# Patient Record
Sex: Female | Born: 1995 | Race: Black or African American | Hispanic: No | Marital: Single | Smoking: Never smoker
Health system: Southern US, Community
[De-identification: ages and names within clinical notes are randomized; demographics above are authoritative.]

## PROBLEM LIST (undated history)

## (undated) DIAGNOSIS — D649 Anemia, unspecified: Secondary | ICD-10-CM

## (undated) DIAGNOSIS — D219 Benign neoplasm of connective and other soft tissue, unspecified: Secondary | ICD-10-CM

## (undated) DIAGNOSIS — Z789 Other specified health status: Secondary | ICD-10-CM

## (undated) HISTORY — DX: Benign neoplasm of connective and other soft tissue, unspecified: D21.9

## (undated) HISTORY — DX: Other specified health status: Z78.9

## (undated) HISTORY — PX: NO PAST SURGERIES: SHX2092

---
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
## 2016-06-17 ENCOUNTER — Encounter (HOSPITAL_COMMUNITY): Payer: Self-pay | Admitting: Emergency Medicine

## 2016-06-17 ENCOUNTER — Ambulatory Visit (HOSPITAL_COMMUNITY)
Admission: EM | Admit: 2016-06-17 | Discharge: 2016-06-17 | Disposition: A | Discharge status: Home / Self Care | Payer: Medicaid Other | Attending: Family Medicine | Admitting: Family Medicine

## 2016-06-17 DIAGNOSIS — K2900 Acute gastritis without bleeding: Secondary | ICD-10-CM

## 2016-06-17 MED ORDER — ESOMEPRAZOLE MAGNESIUM 40 MG PO CPDR: 40.0000 mg | DELAYED_RELEASE_CAPSULE | Freq: Every day | ORAL | 0 refills | Status: DC

## 2016-06-17 NOTE — ED Triage Notes (Signed)
Patient presents to [facility] today with Upper Abdominal pain, in the middle. Patient states that her pain does not change with the different foods she eats.

## 2016-06-17 NOTE — ED Provider Notes (Signed)
[facility]    CSN: [id number] Arrival date & time: 06/17/16  1340     History   Chief Complaint Chief Complaint  Patient presents with  . Abdominal Pain    HPI Gina Burke is a 20 y.o. female.   This is a 20 year old woman who comes in with upper abdominal pain unrelated to food.  She's had this problem in the past but usually gone away. At the present time the pain sometimes does radiate up into her substernal area.  Patient's had no fever, nausea, vomiting, blood in stool, diarrhea.  Patient works for [employer].      Past Medical History:  Diagnosis Date  . Medical history non-contributory     Patient Active Problem List   Diagnosis Date Noted  . Refugee health examination 02/09/2013  . Dysmenorrhea 02/09/2013  . Pediatric body mass index (BMI) of 5th percentile to less than 85th percentile for age 10/12/2012    History reviewed. No pertinent surgical history.  OB History    No data available       Home Medications    Prior to Admission medications   Medication Sig Start Date End Date Taking? Authorizing Provider  esomeprazole (NEXIUM) 40 MG capsule Take 1 capsule (40 mg total) by mouth daily. 06/17/16   Robyn Haber, MD    Family History Family History  Problem Relation Age of Onset  . Heart disease Brother     Social History Social History  Substance Use Topics  . Smoking status: Never Smoker  . Smokeless tobacco: Never Used  . Alcohol use No     Allergies   Review of patient's allergies indicates no known allergies.   Review of Systems Review of Systems  Constitutional: Negative.   Respiratory: Negative.   Cardiovascular: Negative.   Gastrointestinal: Positive for abdominal pain. Negative for blood in stool, diarrhea, nausea and vomiting.  Genitourinary: Negative.   Musculoskeletal: Negative.      Physical Exam Triage Vital Signs ED Triage Vitals  Enc Vitals Group     BP 06/17/16 1414  107/69     Pulse Rate 06/17/16 1414 82     Resp 06/17/16 1414 12     Temp 06/17/16 1414 98.2 F (36.8 C)     Temp Source 06/17/16 1414 Oral     SpO2 06/17/16 1414 100 %     Weight --      Height --      Head Circumference --      Peak Flow --      Pain Score 06/17/16 1420 8     Pain Loc --      Pain Edu? --      Excl. in [place]? --    No data found.   Updated Vital Signs BP 107/69 (BP Location: Left Arm)   Pulse 82   Temp 98.2 F (36.8 C) (Oral)   Resp 12   LMP 05/25/2016   SpO2 100%   Physical Exam  Constitutional: She is oriented to person, place, and time. She appears well-developed and well-nourished.  HENT:  Head: Normocephalic.  Right Ear: External ear normal.  Left Ear: External ear normal.  Mouth/Throat: Oropharynx is clear and moist.  Eyes: Conjunctivae and EOM are normal. Pupils are equal, round, and reactive to light.  Neck: Normal range of motion. Neck supple.  Cardiovascular: Normal rate, regular rhythm and normal heart sounds.   Pulmonary/Chest: Effort normal and breath sounds normal.  Abdominal: Soft. Bowel sounds are  normal. There is tenderness. There is no rebound and no guarding.  Musculoskeletal: Normal range of motion.  Neurological: She is alert and oriented to person, place, and time.  Skin: Skin is warm and dry.  Nursing note and vitals reviewed.    UC Treatments / Results  Labs (all labs ordered are listed, but only abnormal results are displayed) Labs Reviewed - No data to display  EKG  EKG Interpretation None       Radiology No results found.  Procedures Procedures (including critical care time)  Medications Ordered in UC Medications - No data to display   Initial Impression / Assessment and Plan / UC Course  I have reviewed the triage vital signs and the nursing notes.  Pertinent labs & imaging results that were available during my care of the patient were reviewed by me and considered in my medical decision making (see  chart for details).  Clinical Course    Final Clinical Impressions(s) / UC Diagnoses   Final diagnoses:  Acute superficial gastritis without hemorrhage    New Prescriptions New Prescriptions   ESOMEPRAZOLE (NEXIUM) 40 MG CAPSULE    Take 1 capsule (40 mg total) by mouth daily.     Robyn Haber, MD 06/17/16 [phone]

## 2019-10-26 ENCOUNTER — Other Ambulatory Visit: Payer: Self-pay

## 2019-10-26 ENCOUNTER — Encounter (HOSPITAL_COMMUNITY): Payer: Self-pay

## 2019-10-26 ENCOUNTER — Ambulatory Visit (INDEPENDENT_AMBULATORY_CARE_PROVIDER_SITE_OTHER): Payer: Self-pay

## 2019-10-26 ENCOUNTER — Ambulatory Visit (HOSPITAL_COMMUNITY)
Admission: EM | Admit: 2019-10-26 | Discharge: 2019-10-26 | Disposition: A | Discharge status: Home / Self Care | Payer: Self-pay | Attending: Family Medicine | Admitting: Family Medicine

## 2019-10-26 DIAGNOSIS — R110 Nausea: Secondary | ICD-10-CM

## 2019-10-26 DIAGNOSIS — R109 Unspecified abdominal pain: Secondary | ICD-10-CM

## 2019-10-26 DIAGNOSIS — Z3202 Encounter for pregnancy test, result negative: Secondary | ICD-10-CM

## 2019-10-26 DIAGNOSIS — K5900 Constipation, unspecified: Secondary | ICD-10-CM

## 2019-10-26 LAB — COMPREHENSIVE METABOLIC PANEL
ALT: 14 U/L (ref 0–44)
AST: 20 U/L (ref 15–41)
Albumin: 3.9 g/dL (ref 3.5–5.0)
Alkaline Phosphatase: 45 U/L (ref 38–126)
Anion gap: 8 (ref 5–15)
BUN: <5 mg/dL — ABNORMAL LOW (ref 6–20)
CO2: 26 mmol/L (ref 22–32)
Calcium: 9.3 mg/dL (ref 8.9–10.3)
Chloride: 107 mmol/L (ref 98–111)
Creatinine, Ser: 0.66 mg/dL (ref 0.44–1.00)
GFR calc Af Amer: >60 mL/min (ref >60)
GFR calc non Af Amer: >60 mL/min (ref >60)
Glucose, Bld: 84 mg/dL (ref 70–99)
Potassium: 3.8 mmol/L (ref 3.5–5.1)
Sodium: 141 mmol/L (ref 135–145)
Total Bilirubin: 0.6 mg/dL (ref 0.3–1.2)
Total Protein: 6.9 g/dL (ref 6.5–8.1)

## 2019-10-26 LAB — POCT URINALYSIS DIP (DEVICE)
Bilirubin Urine: NEGATIVE
Glucose, UA: NEGATIVE mg/dL
Ketones, ur: NEGATIVE mg/dL
Leukocytes,Ua: NEGATIVE
Nitrite: NEGATIVE
Protein, ur: NEGATIVE mg/dL
Specific Gravity, Urine: >=1.03 (ref 1.005–1.030)
Urobilinogen, UA: 0.2 mg/dL (ref 0.0–1.0)
pH: 6.5 (ref 5.0–8.0)

## 2019-10-26 LAB — CBC WITH DIFFERENTIAL/PLATELET
Abs Immature Granulocytes: 0.01 10*3/uL (ref 0.00–0.07)
Basophils Absolute: 0 10*3/uL (ref 0.0–0.1)
Basophils Relative: 0 %
Eosinophils Absolute: 0 10*3/uL (ref 0.0–0.5)
Eosinophils Relative: 1 %
HCT: 40.1 % (ref 36.0–46.0)
Hemoglobin: 13.4 g/dL (ref 12.0–15.0)
Immature Granulocytes: 0 %
Lymphocytes Relative: 45 %
Lymphs Abs: 1.7 10*3/uL (ref 0.7–4.0)
MCH: 31.2 pg (ref 26.0–34.0)
MCHC: 33.4 g/dL (ref 30.0–36.0)
MCV: 93.3 fL (ref 80.0–100.0)
Monocytes Absolute: 0.4 10*3/uL (ref 0.1–1.0)
Monocytes Relative: 10 %
Neutro Abs: 1.6 10*3/uL — ABNORMAL LOW (ref 1.7–7.7)
Neutrophils Relative %: 44 %
Platelets: 221 10*3/uL (ref 150–400)
RBC: 4.3 MIL/uL (ref 3.87–5.11)
RDW: 11.6 % (ref 11.5–15.5)
WBC: 3.8 10*3/uL — ABNORMAL LOW (ref 4.0–10.5)
nRBC: 0 % (ref 0.0–0.2)

## 2019-10-26 LAB — CBG MONITORING, ED
Glucose-Capillary: 65 mg/dL — ABNORMAL LOW (ref 70–99)
Glucose-Capillary: 65 mg/dL — ABNORMAL LOW (ref 70–99)

## 2019-10-26 LAB — POC URINE PREG, ED: Preg Test, Ur: NEGATIVE

## 2019-10-26 LAB — LIPASE, BLOOD: Lipase: 29 U/L (ref 11–51)

## 2019-10-26 LAB — POCT PREGNANCY, URINE: Preg Test, Ur: NEGATIVE

## 2019-10-26 LAB — GLUCOSE, CAPILLARY: Glucose-Capillary: 65 mg/dL — ABNORMAL LOW (ref 70–99)

## 2019-10-26 MED ORDER — ONDANSETRON 4 MG PO TBDP: 4.0000 mg | ORAL_TABLET | Freq: Three times a day (TID) | ORAL | 0 refills | Status: DC | PRN

## 2019-10-26 NOTE — Discharge Instructions (Signed)
You have been seen today for abdominal pain. Your evaluation was not suggestive of any emergent condition requiring medical intervention at this time. However, some abdominal problems make take more time to appear. Therefore, it is very important for you to pay attention to any new symptoms or worsening of your current condition.  Please return here or to the Emergency Department immediately should you begin to feel worse in any way or have any of the following symptoms: increasing or different abdominal pain, persistent vomiting, inability to drink fluids, fevers, or shaking chills.   We have sent testing for sexually transmitted infections. We will notify you of any positive results once they are received. If required, we will prescribe any medications you might need.  Please refrain from all sexual activity for at least the next seven days.    

## 2019-10-26 NOTE — ED Triage Notes (Signed)
Pt present abdominal pain with dry lips, symptoms started over a week ago.  Pt states that when she eats it feels like she is going to vomit.

## 2019-10-26 NOTE — ED Provider Notes (Signed)
[place]   [id number] 10/26/19 Arrival Time: [id number]  ASSESSMENT & PLAN:  1. Abdominal discomfort   2. Constipation, unspecified constipation type   3. Nausea without vomiting     I have personally viewed the imaging studies ordered this visit. Much stool. No signs of SBO.  Will begin taking her acid reflux medication daily; possible GERD component to current symptoms. Given duration of discomfort with no specific worsening and no fever I have a low suspicion for appendicitis but did discuss. Tolerating fluids. Will also begin taking Miralax. CBG WNL. UPT negative.   Meds ordered this encounter  Medications  . ondansetron (ZOFRAN-ODT) 4 MG disintegrating tablet    Sig: Take 1 tablet (4 mg total) by mouth every 8 (eight) hours as needed for nausea or vomiting.    Dispense:  15 tablet    Refill:  0    Pending: Labs Reviewed  CBC WITH DIFFERENTIAL/PLATELET  COMPREHENSIVE METABOLIC PANEL  LIPASE, BLOOD  CERVICOVAGINAL ANCILLARY ONLY   No STI empiric treatment desired.     Discharge Instructions     You have been seen today for abdominal pain. Your evaluation was not suggestive of any emergent condition requiring medical intervention at this time. However, some abdominal problems make take more time to appear. Therefore, it is very important for you to pay attention to any new symptoms or worsening of your current condition.  Please return here or to the Emergency Department immediately should you begin to feel worse in any way or have any of the following symptoms: increasing or different abdominal pain, persistent vomiting, inability to drink fluids, fevers, or shaking chills.   We have sent testing for sexually transmitted infections. We will notify you of any positive results once they are received. If required, we will prescribe any medications you might need.  Please refrain from all sexual activity for at least the next seven days.       Reviewed  expectations re: course of current medical issues. Questions answered. Outlined signs and symptoms indicating need for more acute intervention. Patient verbalized understanding. After Visit Summary given.   SUBJECTIVE: History from: patient. Gina Burke is a 24 y.o. female who presents with complaint of fairly persistent non-radiating lower abdominal discomfort. Gradual onset. First noted approx 1 week ago. Mild nausea at times without vomiting. More nausea if she tries to eat. Decreased appetite and PO intake. Afebrile. No specific urinary symptoms. Reports last bowel movement approx one week ago. Normal flatus. H/O GERD; uses Prilosec prn. Has not taken this week. Ambulatory without assistance. Questions occasional h/o constipation. No new medications. No OTC treatment reported.  She is sexually active with one female partner. Reports no concern over STI but would like to be tested.  History reviewed. No pertinent surgical history.   OBJECTIVE:  Vitals:   10/26/19 1016  BP: 117/79  Pulse: 80  Resp: 16  Temp: 98.2 F (36.8 C)  TempSrc: Oral  SpO2: 100%    General appearance: alert, oriented, no acute distress HEENT: [place]; AT Lungs: unlabored respirations; speaks full sentences without difficulty Abdomen: soft; without distention; mild  and poorly localized tenderness to palpation over lower abdomen; without masses or organomegaly; without guarding or rebound tenderness Back: without reported CVA tenderness; FROM at waist Extremities: without LE edema; symmetrical; without gross deformities Skin: warm and dry Neurologic: normal gait Psychological: alert and cooperative; normal mood and affect  Labs: Results for orders placed or performed during the hospital encounter of 10/26/19  Glucose, capillary  Result Value Ref Range   Glucose-Capillary 65 (L) 70 - 99 mg/dL  POC CBG monitoring  Result Value Ref Range   Glucose-Capillary 65 (L) 70 - 99 mg/dL  POCT urinalysis dip (device)   Result Value Ref Range   Glucose, UA NEGATIVE NEGATIVE mg/dL   Bilirubin Urine NEGATIVE NEGATIVE   Ketones, ur NEGATIVE NEGATIVE mg/dL   Specific Gravity, Urine >=1.030 1.005 - 1.030   Hgb urine dipstick SMALL (A) NEGATIVE   pH 6.5 5.0 - 8.0   Protein, ur NEGATIVE NEGATIVE mg/dL   Urobilinogen, UA 0.2 0.0 - 1.0 mg/dL   Nitrite NEGATIVE NEGATIVE   Leukocytes,Ua NEGATIVE NEGATIVE  Pregnancy, urine POC  Result Value Ref Range   Preg Test, Ur NEGATIVE NEGATIVE  POC urine preg, ED (not at [facility])  Result Value Ref Range   Preg Test, Ur NEGATIVE NEGATIVE  CBG monitoring, ED  Result Value Ref Range   Glucose-Capillary 65 (L) 70 - 99 mg/dL   Labs Reviewed  GLUCOSE, CAPILLARY - Abnormal; Notable for the following components:      Result Value   Glucose-Capillary 65 (*)    All other components within normal limits  CBG MONITORING, ED - Abnormal; Notable for the following components:   Glucose-Capillary 65 (*)    All other components within normal limits  POCT URINALYSIS DIP (DEVICE) - Abnormal; Notable for the following components:   Hgb urine dipstick SMALL (*)    All other components within normal limits  CBG MONITORING, ED - Abnormal; Notable for the following components:   Glucose-Capillary 65 (*)    All other components within normal limits  CBC WITH DIFFERENTIAL/PLATELET  COMPREHENSIVE METABOLIC PANEL  LIPASE, BLOOD  POCT PREGNANCY, URINE  POC URINE PREG, ED  CERVICOVAGINAL ANCILLARY ONLY    Imaging: DG Abd 2 Views  Result Date: 10/26/2019 CLINICAL DATA:  Constipation and nausea EXAM: ABDOMEN - 2 VIEW COMPARISON:  None. FINDINGS: Supine and upright images were obtained. There is diffuse stool throughout the colon. There is no bowel dilatation or air-fluid level to suggest bowel obstruction. No free air. Lung bases are clear. No abnormal calcification. IMPRESSION: Diffuse stool throughout colon, a finding likely indicative of constipation. No bowel obstruction or free air. Lung  bases clear. Electronically Signed   By: Lowella Grip III M.D.   On: 10/26/2019 11:46     No Known Allergies                                             Past Medical History:  Diagnosis Date  . Medical history non-contributory     Social History   Socioeconomic History  . Marital status: Single    Spouse name: Not on file  . Number of children: Not on file  . Years of education: Not on file  . Highest education level: Not on file  Occupational History  . Not on file  Tobacco Use  . Smoking status: Never Smoker  . Smokeless tobacco: Never Used  Substance and Sexual Activity  . Alcohol use: No  . Drug use: No  . Sexual activity: Never    Birth control/protection: Abstinence  Other Topics Concern  . Not on file  Social History Narrative  . Not on file   Social Determinants of Health   Financial Resource Strain:   . Difficulty of Paying Living Expenses: Not on file  Food Insecurity:   . Worried About Charity fundraiser in the Last Year: Not on file  . Ran Out of Food in the Last Year: Not on file  Transportation Needs:   . Lack of Transportation (Medical): Not on file  . Lack of Transportation (Non-Medical): Not on file  Physical Activity:   . Days of Exercise per Week: Not on file  . Minutes of Exercise per Session: Not on file  Stress:   . Feeling of Stress : Not on file  Social Connections:   . Frequency of Communication with Friends and Family: Not on file  . Frequency of Social Gatherings with Friends and Family: Not on file  . Attends Religious Services: Not on file  . Active Member of Clubs or Organizations: Not on file  . Attends Archivist Meetings: Not on file  . Marital Status: Not on file  Intimate Partner Violence:   . Fear of Current or Ex-Partner: Not on file  . Emotionally Abused: Not on file  . Physically Abused: Not on file  . Sexually Abused: Not on file    Family History  Problem Relation Age of Onset  . Heart disease  Brother      Vanessa Kick, MD 10/26/19 1434

## 2019-10-28 ENCOUNTER — Telehealth (HOSPITAL_COMMUNITY): Payer: Self-pay | Admitting: Emergency Medicine

## 2019-10-28 LAB — CERVICOVAGINAL ANCILLARY ONLY
Bacterial vaginitis: POSITIVE — AB
Candida vaginitis: NEGATIVE
Chlamydia: NEGATIVE
Neisseria Gonorrhea: NEGATIVE
Trichomonas: NEGATIVE

## 2019-10-28 MED ORDER — METRONIDAZOLE 500 MG PO TABS: 500.0000 mg | ORAL_TABLET | Freq: Two times a day (BID) | ORAL | 0 refills | Status: AC

## 2019-10-28 NOTE — Telephone Encounter (Signed)
Bacterial vaginosis is positive. Pt needs treatment. Flagyl 500 mg BID x 7 days #14 no refills sent to patients pharmacy of choice.    Attempted to reach patient. No answer at this time. Voicemail left.

## 2020-04-12 ENCOUNTER — Ambulatory Visit (HOSPITAL_COMMUNITY): Payer: Self-pay

## 2020-06-27 ENCOUNTER — Other Ambulatory Visit: Payer: Medicaid Other

## 2020-06-27 DIAGNOSIS — Z20822 Contact with and (suspected) exposure to covid-19: Secondary | ICD-10-CM

## 2020-06-28 LAB — SARS-COV-2, NAA 2 DAY TAT

## 2020-06-28 LAB — NOVEL CORONAVIRUS, NAA: SARS-CoV-2, NAA: NOT DETECTED

## 2020-07-25 ENCOUNTER — Encounter (HOSPITAL_COMMUNITY): Payer: Self-pay | Admitting: *Deleted

## 2020-07-25 ENCOUNTER — Other Ambulatory Visit: Payer: Self-pay

## 2020-07-25 ENCOUNTER — Ambulatory Visit (HOSPITAL_COMMUNITY)
Admission: EM | Admit: 2020-07-25 | Discharge: 2020-07-25 | Disposition: A | Discharge status: Home / Self Care | Payer: BC Managed Care – PPO | Attending: Internal Medicine | Admitting: Internal Medicine

## 2020-07-25 DIAGNOSIS — R1084 Generalized abdominal pain: Secondary | ICD-10-CM

## 2020-07-25 DIAGNOSIS — R112 Nausea with vomiting, unspecified: Secondary | ICD-10-CM | POA: Diagnosis not present

## 2020-07-25 LAB — POCT URINALYSIS DIPSTICK, ED / UC
Bilirubin Urine: NEGATIVE
Glucose, UA: NEGATIVE mg/dL
Ketones, ur: NEGATIVE mg/dL
Leukocytes,Ua: NEGATIVE
Nitrite: NEGATIVE
Protein, ur: NEGATIVE mg/dL
Specific Gravity, Urine: 1.01 (ref 1.005–1.030)
Urobilinogen, UA: 0.2 mg/dL (ref 0.0–1.0)
pH: 7 (ref 5.0–8.0)

## 2020-07-25 LAB — POC URINE PREG, ED: Preg Test, Ur: NEGATIVE

## 2020-07-25 MED ORDER — ONDANSETRON HCL 4 MG PO TABS: 4.0000 mg | ORAL_TABLET | Freq: Three times a day (TID) | ORAL | 0 refills | Status: DC | PRN

## 2020-07-25 NOTE — ED Triage Notes (Signed)
Pt unsure if she is preg. Pt has nausea and ABD pain

## 2020-07-25 NOTE — ED Provider Notes (Signed)
[place]    CSN: [id number] Arrival date & time: 07/25/20  1216      History   Chief Complaint Chief Complaint  Patient presents with  . Abdominal Pain    HPI Gina Burke is a 24 y.o. female.   Patient here c/w abdominal pain x 2 weeks.  Waxes and wanes, lasts about 20 minutes then resolves.  She has it multiple times per day.  Denies f/c, diarrhea, constipation, hematochezia, melena, GU sx.  LMP 1 month ago.  She has not tried anything for the pain.  Not currently in any pain at time of evaluation.     Past Medical History:  Diagnosis Date  . Medical history non-contributory     Patient Active Problem List   Diagnosis Date Noted  . Refugee health examination 02/09/2013  . Dysmenorrhea 02/09/2013  . Pediatric body mass index (BMI) of 5th percentile to less than 85th percentile for age 20/24/2014    History reviewed. No pertinent surgical history.  OB History   No obstetric history on file.      Home Medications    Prior to Admission medications   Medication Sig Start Date End Date Taking? Authorizing Provider  esomeprazole (NEXIUM) 40 MG capsule Take 1 capsule (40 mg total) by mouth daily. 06/17/16   Robyn Haber, MD  ondansetron (ZOFRAN-ODT) 4 MG disintegrating tablet Take 1 tablet (4 mg total) by mouth every 8 (eight) hours as needed for nausea or vomiting. 10/26/19   Vanessa Kick, MD    Family History Family History  Problem Relation Age of Onset  . Heart disease Brother     Social History Social History   Tobacco Use  . Smoking status: Never Smoker  . Smokeless tobacco: Never Used  Substance Use Topics  . Alcohol use: No  . Drug use: No     Allergies   Patient has no known allergies.   Review of Systems Review of Systems  Constitutional: Negative for chills, fatigue and fever.  Gastrointestinal: Positive for abdominal distention, abdominal pain, nausea and vomiting (last episode 3 days ago). Negative for anal bleeding,  blood in stool, constipation and diarrhea.  Genitourinary: Negative for difficulty urinating, dysuria, flank pain, frequency, menstrual problem, pelvic pain, urgency, vaginal bleeding, vaginal discharge and vaginal pain.  Musculoskeletal: Negative for arthralgias and myalgias.  Skin: Negative for color change.  Hematological: Negative for adenopathy. Does not bruise/bleed easily.  Psychiatric/Behavioral: Negative for confusion and sleep disturbance.     Physical Exam Triage Vital Signs ED Triage Vitals  Enc Vitals Group     BP 07/25/20 1404 114/79     Pulse Rate 07/25/20 1404 82     Resp 07/25/20 1404 16     Temp 07/25/20 1404 97.7 F (36.5 C)     Temp src --      SpO2 07/25/20 1404 98 %     Weight --      Height --      Head Circumference --      Peak Flow --      Pain Score 07/25/20 1400 0     Pain Loc --      Pain Edu? --      Excl. in [place]? --    No data found.  Updated Vital Signs BP 114/79 (BP Location: Right Arm)   Pulse 82   Temp 97.7 F (36.5 C)   Resp 16   LMP 06/26/2020 Comment: possible preg  SpO2 98%   Visual Acuity Right  Eye Distance:   Left Eye Distance:   Bilateral Distance:    Right Eye Near:   Left Eye Near:    Bilateral Near:     Physical Exam Vitals and nursing note reviewed.  Constitutional:      General: She is not in acute distress.    Appearance: Normal appearance. She is not ill-appearing.  HENT:     Head: Normocephalic and atraumatic.     Right Ear: Tympanic membrane and ear canal normal.     Left Ear: Tympanic membrane and ear canal normal.     Nose: No congestion or rhinorrhea.     Mouth/Throat:     Pharynx: No oropharyngeal exudate or posterior oropharyngeal erythema.  Eyes:     General: No scleral icterus.    Extraocular Movements: Extraocular movements intact.     Conjunctiva/sclera: Conjunctivae normal.  Cardiovascular:     Rate and Rhythm: Normal rate and regular rhythm.     Heart sounds: No murmur heard.   Pulmonary:      Effort: Pulmonary effort is normal. No respiratory distress.     Breath sounds: Normal breath sounds. No wheezing or rales.  Abdominal:     Tenderness: There is generalized abdominal tenderness and tenderness in the right lower quadrant, epigastric area, suprapubic area and left lower quadrant. There is no right CVA tenderness, left CVA tenderness or rebound. Negative signs include Murphy's sign, Rovsing's sign, McBurney's sign and obturator sign.  Musculoskeletal:     Cervical back: Normal range of motion. No rigidity.  Skin:    Capillary Refill: Capillary refill takes less than 2 seconds.     Coloration: Skin is not jaundiced.     Findings: No rash.  Neurological:     General: No focal deficit present.     Mental Status: She is alert and oriented to person, place, and time.     Motor: No weakness.     Gait: Gait normal.  Psychiatric:        Mood and Affect: Mood normal.        Behavior: Behavior normal.      UC Treatments / Results  Labs (all labs ordered are listed, but only abnormal results are displayed) Labs Reviewed  POCT URINALYSIS DIPSTICK, ED / UC - Abnormal; Notable for the following components:      Result Value   Hgb urine dipstick TRACE (*)    All other components within normal limits  POC URINE PREG, ED    EKG   Radiology No results found.  Procedures Procedures (including critical care time)  Medications Ordered in UC Medications - No data to display  Initial Impression / Assessment and Plan / UC Course  I have reviewed the triage vital signs and the nursing notes.  Pertinent labs & imaging results that were available during my care of the patient were reviewed by me and considered in my medical decision making (see chart for details).     Follow up with PCP Go to ED with worsening symptoms. Take medication as prescribed PRN for nausea Final Clinical Impressions(s) / UC Diagnoses   Final diagnoses:  None   Discharge Instructions   None     ED Prescriptions    None     PDMP not reviewed this encounter.   Peri Jefferson, PA-C 07/25/20 1515

## 2020-07-25 NOTE — Discharge Instructions (Addendum)
Follow up with PCP Go to ER with any worsening symptoms.

## 2020-10-16 DIAGNOSIS — N939 Abnormal uterine and vaginal bleeding, unspecified: Secondary | ICD-10-CM

## 2020-10-16 HISTORY — DX: Abnormal uterine and vaginal bleeding, unspecified: N93.9

## 2020-10-21 ENCOUNTER — Emergency Department (HOSPITAL_COMMUNITY)
Admission: EM | Admit: 2020-10-21 | Discharge: 2020-10-21 | Disposition: A | Discharge status: Home / Self Care | Payer: BC Managed Care – PPO | Attending: Emergency Medicine | Admitting: Emergency Medicine

## 2020-10-21 ENCOUNTER — Other Ambulatory Visit: Payer: Self-pay

## 2020-10-21 ENCOUNTER — Encounter (HOSPITAL_COMMUNITY): Payer: Self-pay

## 2020-10-21 DIAGNOSIS — N939 Abnormal uterine and vaginal bleeding, unspecified: Secondary | ICD-10-CM | POA: Diagnosis not present

## 2020-10-21 DIAGNOSIS — R102 Pelvic and perineal pain: Secondary | ICD-10-CM | POA: Diagnosis not present

## 2020-10-21 DIAGNOSIS — R1110 Vomiting, unspecified: Secondary | ICD-10-CM | POA: Diagnosis not present

## 2020-10-21 DIAGNOSIS — N946 Dysmenorrhea, unspecified: Secondary | ICD-10-CM | POA: Insufficient documentation

## 2020-10-21 DIAGNOSIS — R1084 Generalized abdominal pain: Secondary | ICD-10-CM | POA: Diagnosis not present

## 2020-10-21 DIAGNOSIS — R109 Unspecified abdominal pain: Secondary | ICD-10-CM | POA: Diagnosis not present

## 2020-10-21 LAB — URINALYSIS, ROUTINE W REFLEX MICROSCOPIC
Bacteria, UA: NONE SEEN
Bilirubin Urine: NEGATIVE
Glucose, UA: NEGATIVE mg/dL
Ketones, ur: NEGATIVE mg/dL
Nitrite: NEGATIVE
Protein, ur: 30 mg/dL — AB
RBC / HPF: >50 RBC/hpf — ABNORMAL HIGH (ref 0–5)
Specific Gravity, Urine: 1.017 (ref 1.005–1.030)
pH: 7 (ref 5.0–8.0)

## 2020-10-21 LAB — CBC
HCT: 38.9 % (ref 36.0–46.0)
Hemoglobin: 12.8 g/dL (ref 12.0–15.0)
MCH: 31.1 pg (ref 26.0–34.0)
MCHC: 32.9 g/dL (ref 30.0–36.0)
MCV: 94.6 fL (ref 80.0–100.0)
Platelets: 239 10*3/uL (ref 150–400)
RBC: 4.11 MIL/uL (ref 3.87–5.11)
RDW: 11.8 % (ref 11.5–15.5)
WBC: 5.2 10*3/uL (ref 4.0–10.5)
nRBC: 0 % (ref 0.0–0.2)

## 2020-10-21 LAB — COMPREHENSIVE METABOLIC PANEL
ALT: 15 U/L (ref 0–44)
AST: 24 U/L (ref 15–41)
Albumin: 4.1 g/dL (ref 3.5–5.0)
Alkaline Phosphatase: 41 U/L (ref 38–126)
Anion gap: 11 (ref 5–15)
BUN: 9 mg/dL (ref 6–20)
CO2: 21 mmol/L — ABNORMAL LOW (ref 22–32)
Calcium: 9.1 mg/dL (ref 8.9–10.3)
Chloride: 104 mmol/L (ref 98–111)
Creatinine, Ser: 0.58 mg/dL (ref 0.44–1.00)
GFR, Estimated: >60 mL/min (ref >60)
Glucose, Bld: 89 mg/dL (ref 70–99)
Potassium: 3.7 mmol/L (ref 3.5–5.1)
Sodium: 136 mmol/L (ref 135–145)
Total Bilirubin: 0.6 mg/dL (ref 0.3–1.2)
Total Protein: 6.8 g/dL (ref 6.5–8.1)

## 2020-10-21 LAB — I-STAT BETA HCG BLOOD, ED (MC, WL, AP ONLY): I-stat hCG, quantitative: <5 m[IU]/mL (ref <5)

## 2020-10-21 LAB — LIPASE, BLOOD: Lipase: 41 U/L (ref 11–51)

## 2020-10-21 MED ORDER — KETOROLAC TROMETHAMINE 60 MG/2ML IM SOLN
30.0000 mg | Freq: Once | INTRAMUSCULAR | Status: AC
  Administered 2020-10-21: 30 mg via INTRAMUSCULAR
  Filled 2020-10-21: qty 2

## 2020-10-21 MED ORDER — ONDANSETRON 4 MG PO TBDP: 4.0000 mg | ORAL_TABLET | Freq: Three times a day (TID) | ORAL | 0 refills | Status: DC | PRN

## 2020-10-21 NOTE — ED Triage Notes (Signed)
Pt reports that she is having period cramps. Started menstrual cycle five days ago. Cramps usually only occur on day 1 and it has now been 5 days of discomfort. Reports vomiting at 0300am and diarrhea.

## 2020-10-21 NOTE — ED Notes (Signed)
Patient ambulatory to and from restroom independently with steady gait.

## 2020-10-21 NOTE — ED Provider Notes (Signed)
[place] EMERGENCY DEPARTMENT Provider Note   CSN: [id number] Arrival date & time: 10/21/20  0407     History Chief Complaint  Patient presents with  . Emesis    Gina Burke is a 25 y.o. female with no significant past medical history who presents the emergency department with a chief complaint of abdominal pain.  The patient reports that she has been on her menstrual cycle for the last 5 days, which has been heavier than her typical..  Reports that she has been passing large clots and has been changing pads more frequently than usual.  She reports associated generalized abdominal cramping that has been worse than her baseline abdominal cramps.  She reports that tonight she was unable to sleep due to the intensity of the cramps.  She has been taking NSAIDs, last dose was this a.m.  She denies vaginal discharge, dysuria, hematuria, fever, chills, flank pain.  She does report that she had one episode of vomiting earlier tonight and a second episode 4 nights ago.   No known sick contacts.  She is sexually active, but has no concerns for pregnancy or STIs.  The history is provided by the patient, medical records and a relative. No language interpreter was used.       Past Medical History:  Diagnosis Date  . Medical history non-contributory     Patient Active Problem List   Diagnosis Date Noted  . Refugee health examination 02/09/2013  . Dysmenorrhea 02/09/2013  . Pediatric body mass index (BMI) of 5th percentile to less than 85th percentile for age 20/24/2014    History reviewed. No pertinent surgical history.   OB History   No obstetric history on file.     Family History  Problem Relation Age of Onset  . Heart disease Brother     Social History   Tobacco Use  . Smoking status: Never Smoker  . Smokeless tobacco: Never Used  Substance Use Topics  . Alcohol use: No  . Drug use: No    Home Medications Prior to Admission medications    Medication Sig Start Date End Date Taking? Authorizing Provider  ondansetron (ZOFRAN ODT) 4 MG disintegrating tablet Take 1 tablet (4 mg total) by mouth every 8 (eight) hours as needed. 10/21/20  Yes Jahmad Petrich A, PA-C  esomeprazole (NEXIUM) 40 MG capsule Take 1 capsule (40 mg total) by mouth daily. 06/17/16   Robyn Haber, MD  ondansetron (ZOFRAN) 4 MG tablet Take 1 tablet (4 mg total) by mouth every 8 (eight) hours as needed for nausea or vomiting. 07/25/20   Peri Jefferson, PA-C    Allergies    Patient has no known allergies.  Review of Systems   Review of Systems  Constitutional: Negative for activity change, chills and fever.  HENT: Negative for congestion and sore throat.   Eyes: Negative for visual disturbance.  Respiratory: Negative for shortness of breath and wheezing.   Cardiovascular: Negative for chest pain.  Gastrointestinal: Positive for abdominal pain and vomiting. Negative for constipation and diarrhea.  Genitourinary: Positive for vaginal bleeding. Negative for dysuria, enuresis, flank pain, frequency, hematuria and urgency.  Musculoskeletal: Negative for back pain, gait problem, myalgias, neck pain and neck stiffness.  Skin: Negative for rash and wound.  Allergic/Immunologic: Negative for immunocompromised state.  Neurological: Negative for dizziness, seizures, syncope, weakness, numbness and headaches.  Psychiatric/Behavioral: Negative for confusion.    Physical Exam Updated Vital Signs BP 123/71   Pulse 82   Temp 98.2 F (36.8  C) (Oral)   Resp 16   Ht 5\' 4"  (1.626 m)   Wt 63.5 kg   LMP 10/16/2020 (Approximate)   SpO2 100%   BMI 24.03 kg/m   Physical Exam Vitals and nursing note reviewed.  Constitutional:      General: She is not in acute distress.    Appearance: She is not ill-appearing, toxic-appearing or diaphoretic.  HENT:     Head: Normocephalic.  Eyes:     Conjunctiva/sclera: Conjunctivae normal.  Cardiovascular:     Rate and Rhythm:  Normal rate and regular rhythm.     Heart sounds: No murmur heard. No friction rub. No gallop.   Pulmonary:     Effort: Pulmonary effort is normal. No respiratory distress.     Breath sounds: No stridor. No wheezing, rhonchi or rales.  Chest:     Chest wall: No tenderness.  Abdominal:     General: There is no distension.     Palpations: Abdomen is soft. There is no mass.     Tenderness: There is abdominal tenderness. There is no right CVA tenderness, left CVA tenderness, guarding or rebound.     Hernia: No hernia is present.     Comments: Mild tenderness palpation in the bilateral pelvic regions.  No rebound or guarding.  Normoactive bowel sounds.  No tenderness over McBurney's point.  Negative Murphy sign.  No CVA tenderness bilaterally.  Musculoskeletal:     Cervical back: Neck supple.  Skin:    General: Skin is warm.     Findings: No rash.  Neurological:     Mental Status: She is alert.  Psychiatric:        Behavior: Behavior normal.     ED Results / Procedures / Treatments   Labs (all labs ordered are listed, but only abnormal results are displayed) Labs Reviewed  COMPREHENSIVE METABOLIC PANEL - Abnormal; Notable for the following components:      Result Value   CO2 21 (*)    All other components within normal limits  URINALYSIS, ROUTINE W REFLEX MICROSCOPIC - Abnormal; Notable for the following components:   APPearance HAZY (*)    Hgb urine dipstick LARGE (*)    Protein, ur 30 (*)    Leukocytes,Ua TRACE (*)    RBC / HPF >50 (*)    All other components within normal limits  LIPASE, BLOOD  CBC  I-STAT BETA HCG BLOOD, ED ([facility], [facility], [facility] ONLY)    EKG None  Radiology No results found.  Procedures Procedures   Medications Ordered in ED Medications  ketorolac (TORADOL) injection 30 mg (30 mg Intramuscular Given 10/21/20 1610)    ED Course  I have reviewed the triage vital signs and the nursing notes.  Pertinent labs & imaging results that were available during  my care of the patient were reviewed by me and considered in my medical decision making (see chart for details).    MDM Rules/Calculators/A&P                          25 year old female with a significant past medical history who presents the emergency department with vaginal bleeding.  Patient is on her menstrual cycle that has been more painful with heavier bleeding over the last 5 days than baseline.  She has had 2 episodes of nonbloody, nonbilious vomiting since onset of her menstrual cycle due to the intensity of the pain.  Tonight, she was unable to sleep due to the intensity  of the abdominal cramping.  She has been taking Aleve, but has not had a dose in almost 24 hours.  Labs ordered by triage have been reviewed and independently interpreted by me.  Pregnancy test is negative.  No metabolic derangements.  UA is not concerning for infection.  Hemoglobin is stable.  Have a low suspicion for symptomatic anemia, ectopic pregnancy, diverticulitis, appendicitis, UTI, pyelonephritis, diverticulitis, bowel obstruction.  Patient was given Toradol for pain control.  She was advised to alternate between OTC Tylenol and NSAIDs.  We will give Zofran for vomiting.  She also given referral to OB/GYN if she continues to have recurrent abnormal uterine bleeding.  Questions answered.  The patient is hemodynamically stable in no acute distress.  Safe discharge home with outpatient follow-up indicated.  Final Clinical Impression(s) / ED Diagnoses Final diagnoses:  Menstrual cramps    Rx / DC Orders ED Discharge Orders         Ordered    ondansetron (ZOFRAN ODT) 4 MG disintegrating tablet  Every 8 hours PRN        10/21/20 0619           Joline Maxcy A, PA-C 10/21/20 0851    Little, Wenda Overland, MD 10/21/20 2010

## 2020-10-21 NOTE — Discharge Instructions (Addendum)
Thank you for allowing me to care for you today in the Emergency Department.   Take 650 mg of Tylenol or 600 mg of ibuprofen with food every 6 hours for pain.  You can alternate between these 2 medications every 3 hours if your pain returns.  For instance, you can take Tylenol at noon, followed by a dose of ibuprofen at 3, followed by second dose of Tylenol and 6.  You can also use a heating pad as directed.  You can also try using Midol instead of ibuprofen.  Do not use both of these medications at the same time as they both contain NSAIDs.  However, it is safe to use Tylenol with Midol.  Midol is available over-the-counter.  Let 1 tablet of Zofran dissolve under your tongue every 8 hours as needed for nausea or vomiting.  Please call to schedule follow-up appoint with an OB/GYN if you continue to have heavy, painful periods.  As we discussed, symptoms you can have a couple of periods that are heavy and painful that may then resolved, but if these persist following up with OB/GYN is a good option.  You should return to the emergency department if you develop heavy, painful periods with significant blood loss and you become short of breath, if you pass out, if you become very weak and cannot walk, if you develop uncontrollable vomiting and stop producing urine, if you develop severe abdominal pain with a high fever, or other new, concerning symptoms.

## 2020-10-23 ENCOUNTER — Ambulatory Visit (HOSPITAL_COMMUNITY)
Admission: EM | Admit: 2020-10-23 | Discharge: 2020-10-23 | Disposition: A | Discharge status: Home / Self Care | Payer: BC Managed Care – PPO

## 2020-10-23 ENCOUNTER — Encounter (HOSPITAL_COMMUNITY): Payer: Self-pay

## 2020-10-23 ENCOUNTER — Other Ambulatory Visit: Payer: Self-pay

## 2020-10-23 DIAGNOSIS — Z3202 Encounter for pregnancy test, result negative: Secondary | ICD-10-CM

## 2020-10-23 DIAGNOSIS — N920 Excessive and frequent menstruation with regular cycle: Secondary | ICD-10-CM | POA: Diagnosis not present

## 2020-10-23 DIAGNOSIS — R109 Unspecified abdominal pain: Secondary | ICD-10-CM

## 2020-10-23 DIAGNOSIS — N946 Dysmenorrhea, unspecified: Secondary | ICD-10-CM | POA: Diagnosis not present

## 2020-10-23 LAB — POCT URINALYSIS DIPSTICK, ED / UC
Bilirubin Urine: NEGATIVE
Glucose, UA: NEGATIVE mg/dL
Ketones, ur: NEGATIVE mg/dL
Leukocytes,Ua: NEGATIVE
Nitrite: NEGATIVE
Protein, ur: NEGATIVE mg/dL
Specific Gravity, Urine: 1.02 (ref 1.005–1.030)
Urobilinogen, UA: 0.2 mg/dL (ref 0.0–1.0)
pH: 8.5 — ABNORMAL HIGH (ref 5.0–8.0)

## 2020-10-23 LAB — POC URINE PREG, ED: Preg Test, Ur: NEGATIVE

## 2020-10-23 MED ORDER — KETOROLAC TROMETHAMINE 60 MG/2ML IM SOLN
60.0000 mg | Freq: Once | INTRAMUSCULAR | Status: AC
  Administered 2020-10-23: 60 mg via INTRAMUSCULAR

## 2020-10-23 MED ORDER — KETOROLAC TROMETHAMINE 60 MG/2ML IM SOLN
INTRAMUSCULAR | Status: AC
  Filled 2020-10-23: qty 2

## 2020-10-23 MED ORDER — MEGESTROL ACETATE 40 MG PO TABS: 40.0000 mg | ORAL_TABLET | Freq: Two times a day (BID) | ORAL | 0 refills | Status: AC

## 2020-10-23 NOTE — ED Triage Notes (Signed)
Pt presents with lower abdominal pain, lower back pain and nausea x 1 week. States the pain is related to menstrual period, she always has pain in the first day, but this time lasted x 1 week. Denies fever chills, dysuria, diarrhea. Tylenol gives no relief.

## 2020-10-23 NOTE — ED Provider Notes (Signed)
[facility]    CSN: [id number] Arrival date & time: 10/23/20  1137      History   Chief Complaint Chief Complaint  Patient presents with  . Abdominal Pain    HPI Gina Burke is a 25 y.o. female presenting with abdominal pain, back pain, nausea.  History dysmenorrhea. Pt presents with lower abdominal pain, lower back pain and nausea x 1 week related to heavy period.  She was seen in the ER for the symptoms 2 days ago.  States the pain is related to menstrual period, she always has pain in the first day, but this time pain has lasted x 1 week. Her period typically only lasts 4 days so this is also much longer than normal. States that her period is much heavier than normal, and she is passing large clots.  States the ER discharged her with Tylenol and ibuprofen, and this is providing minimal relief.  She was told to follow-up with a gynecologist, but she is not able to see them until 3/29-3 weeks from today.  denies hematuria, dysuria, frequency, urgency, fevers/chills, abdnormal vaginal discharge. Not taking anything for birth control.   HPI  Past Medical History:  Diagnosis Date  . Medical history non-contributory     Patient Active Problem List   Diagnosis Date Noted  . Refugee health examination 02/09/2013  . Dysmenorrhea 02/09/2013  . Pediatric body mass index (BMI) of 5th percentile to less than 85th percentile for age 81/24/2014    History reviewed. No pertinent surgical history.  OB History   No obstetric history on file.      Home Medications    Prior to Admission medications   Medication Sig Start Date End Date Taking? Authorizing Provider  acetaminophen (TYLENOL) 500 MG tablet Take 500 mg by mouth every 6 (six) hours as needed.   Yes [provider]  megestrol (MEGACE) 40 MG tablet Take 1 tablet (40 mg total) by mouth 2 (two) times daily for 14 days. 10/23/20 11/06/20 Yes Hazel Sams, PA-C  esomeprazole (NEXIUM) 40 MG capsule Take 1 capsule  (40 mg total) by mouth daily. 06/17/16   Robyn Haber, MD  ondansetron (ZOFRAN ODT) 4 MG disintegrating tablet Take 1 tablet (4 mg total) by mouth every 8 (eight) hours as needed. 10/21/20   McDonald, Mia A, PA-C  ondansetron (ZOFRAN) 4 MG tablet Take 1 tablet (4 mg total) by mouth every 8 (eight) hours as needed for nausea or vomiting. 07/25/20   Peri Jefferson, PA-C    Family History Family History  Problem Relation Age of Onset  . Heart disease Brother     Social History Social History   Tobacco Use  . Smoking status: Never Smoker  . Smokeless tobacco: Never Used  Substance Use Topics  . Alcohol use: No  . Drug use: No     Allergies   Patient has no known allergies.   Review of Systems Review of Systems  Constitutional: Negative for appetite change, chills, diaphoresis and fever.  Respiratory: Negative for shortness of breath.   Cardiovascular: Negative for chest pain.  Gastrointestinal: Positive for abdominal pain and nausea. Negative for blood in stool, constipation, diarrhea and vomiting.  Genitourinary: Positive for menstrual problem and vaginal bleeding. Negative for decreased urine volume, difficulty urinating, dysuria, flank pain, frequency, genital sores, hematuria, pelvic pain, urgency, vaginal discharge and vaginal pain.  Musculoskeletal: Negative for back pain.  Neurological: Negative for dizziness, weakness and light-headedness.  All other systems reviewed and are negative.  Physical Exam Triage Vital Signs ED Triage Vitals  Enc Vitals Group     BP 10/23/20 1243 121/81     Pulse Rate 10/23/20 1243 78     Resp 10/23/20 1243 16     Temp 10/23/20 1243 97.7 F (36.5 C)     Temp Source 10/23/20 1243 Oral     SpO2 10/23/20 1243 100 %     Weight --      Height --      Head Circumference --      Peak Flow --      Pain Score 10/23/20 1240 8     Pain Loc --      Pain Edu? --      Excl. in [place]? --    No data found.  Updated Vital Signs BP 121/81  (BP Location: Right Arm)   Pulse 78   Temp 97.7 F (36.5 C) (Oral)   Resp 16   LMP 09/25/2020 (Exact Date)   SpO2 100%   Visual Acuity Right Eye Distance:   Left Eye Distance:   Bilateral Distance:    Right Eye Near:   Left Eye Near:    Bilateral Near:     Physical Exam Vitals reviewed.  Constitutional:      General: She is not in acute distress.    Appearance: Normal appearance. She is not ill-appearing.  HENT:     Head: Normocephalic and atraumatic.  Cardiovascular:     Rate and Rhythm: Normal rate and regular rhythm.     Heart sounds: Normal heart sounds.  Pulmonary:     Effort: Pulmonary effort is normal.     Breath sounds: Normal breath sounds. No wheezing, rhonchi or rales.  Abdominal:     General: Bowel sounds are normal. There is no distension.     Palpations: Abdomen is soft. There is no mass.     Tenderness: There is abdominal tenderness in the right lower quadrant and left lower quadrant. There is no right CVA tenderness, left CVA tenderness, guarding or rebound. Negative signs include Murphy's sign, Rovsing's sign and McBurney's sign.  Neurological:     General: No focal deficit present.     Mental Status: She is alert and oriented to person, place, and time.  Psychiatric:        Mood and Affect: Mood normal.        Behavior: Behavior normal.      UC Treatments / Results  Labs (all labs ordered are listed, but only abnormal results are displayed) Labs Reviewed  POCT URINALYSIS DIPSTICK, ED / UC - Abnormal; Notable for the following components:      Result Value   Hgb urine dipstick LARGE (*)    pH 8.5 (*)    All other components within normal limits  POC URINE PREG, ED    EKG   Radiology No results found.  Procedures Procedures (including critical care time)  Medications Ordered in UC Medications  ketorolac (TORADOL) injection 60 mg (60 mg Intramuscular Given 10/23/20 1318)    Initial Impression / Assessment and Plan / UC Course  I have  reviewed the triage vital signs and the nursing notes.  Pertinent labs & imaging results that were available during my care of the patient were reviewed by me and considered in my medical decision making (see chart for details).      This patient is a 25 year old female presenting with dysmenorrhea and menorrhagia.  Long history of similar.  She was last seen  at the ER 2 days ago for these symptoms.  At that visit, they checked labs which showed stable hemoglobin, no metabolic derangements, UA within normal limits.  She was given Toradol for pain, and discharged with Tylenol, ibuprofen/NSAIDs, Zofran.  She was given referral to OB/GYN, but is not able to follow-up with them until 11/14/2020.  UA today with large blood, otherwise normal. Urine pregnancy negative. Given duration of period, Megace as below.  Toradol administered today for pain relief.  Continue tylenol and ibuprofen for pain. Continue zofran prn as prescribed by ED. She already has gyn appt scheduled fore 3/29. rec calling them to request sooner appt if possible. Return precautions discussed.  This chart was dictated using voice recognition software, Dragon. Despite the best efforts of this provider to proofread and correct errors, errors may still occur which can change documentation meaning.   Final Clinical Impressions(s) / UC Diagnoses   Final diagnoses:  Dysmenorrhea  Menorrhagia with regular cycle     Discharge Instructions     -Start the medication-Megace (megestrol), 2 pills daily for 14 days.  You can take this with breakfast and dinner.  This medication will stop your period. -Please try to follow-up with gynecologist before you finish the course of Megace, to figure out why you are having this heavy bleeding. -If your abdominal pain gets worse, or if your period gets worse despite treatment-head straight to the ER. -You can take ibuprofen up to 800 mg 3 times daily with food, and Tylenol 1000 mg up to 3 times  daily.  You can take these medications together or alternate them every 3 hours. Avoid ibuprofen today since you had the shot of pain medication here. -Continue zofran as needed.    ED Prescriptions    Medication Sig Dispense Auth. Provider   megestrol (MEGACE) 40 MG tablet Take 1 tablet (40 mg total) by mouth 2 (two) times daily for 14 days. 28 tablet Hazel Sams, PA-C     PDMP not reviewed this encounter.   Hazel Sams, PA-C 10/23/20 1346

## 2020-10-23 NOTE — Discharge Instructions (Addendum)
-  Start the medication-Megace (megestrol), 2 pills daily for 14 days.  You can take this with breakfast and dinner.  This medication will stop your period. -Please try to follow-up with gynecologist before you finish the course of Megace, to figure out why you are having this heavy bleeding. -If your abdominal pain gets worse, or if your period gets worse despite treatment-head straight to the ER. -You can take ibuprofen up to 800 mg 3 times daily with food, and Tylenol 1000 mg up to 3 times daily.  You can take these medications together or alternate them every 3 hours. Avoid ibuprofen today since you had the shot of pain medication here. -Continue zofran as needed.

## 2020-11-01 ENCOUNTER — Other Ambulatory Visit (HOSPITAL_COMMUNITY)
Admission: RE | Admit: 2020-11-01 | Discharge: 2020-11-01 | Disposition: A | Discharge status: Home / Self Care | Payer: BC Managed Care – PPO | Source: Ambulatory Visit | Attending: Obstetrics | Admitting: Obstetrics

## 2020-11-01 ENCOUNTER — Encounter: Payer: Self-pay | Admitting: Women's Health

## 2020-11-01 ENCOUNTER — Other Ambulatory Visit: Payer: Self-pay

## 2020-11-01 ENCOUNTER — Ambulatory Visit (INDEPENDENT_AMBULATORY_CARE_PROVIDER_SITE_OTHER): Payer: BC Managed Care – PPO | Admitting: Women's Health

## 2020-11-01 VITALS — BP 115/75 | HR 88 | Ht 64.0 in | Wt 125.0 lb

## 2020-11-01 DIAGNOSIS — Z113 Encounter for screening for infections with a predominantly sexual mode of transmission: Secondary | ICD-10-CM | POA: Diagnosis not present

## 2020-11-01 DIAGNOSIS — Z124 Encounter for screening for malignant neoplasm of cervix: Secondary | ICD-10-CM | POA: Diagnosis not present

## 2020-11-01 DIAGNOSIS — N924 Excessive bleeding in the premenopausal period: Secondary | ICD-10-CM | POA: Diagnosis not present

## 2020-11-01 NOTE — Patient Instructions (Addendum)
Menorrhagia Menorrhagia is a form of abnormal uterine bleeding in which menstrual periods are heavy or last longer than normal. With menorrhagia, the periods may cause enough blood loss and cramping that a woman becomes unable to take part in her usual activities. What are the causes? Common causes of this condition include:  Polyps or fibroids. These are noncancerous growths in the uterus.  An imbalance of the hormones estrogen and progesterone.  Anovulation, which occurs when one of the ovaries does not release an egg during one or more months.  A problem with the thyroid gland (hypothyroidism).  Side effects of having an intrauterine device (IUD).  Side effects of some medicines, such as NSAIDs or blood thinners.  A bleeding disorder that stops the blood from clotting normally. In some cases, the cause of this condition is not known. What increases the risk? You are more likely to develop this condition if you have cancer of the uterus. What are the signs or symptoms? Symptoms of this condition include:  Routinely having to change your pad or tampon every 1-2 hours because it is soaked.  Needing to use pads and tampons at the same time because of heavy bleeding.  Needing to wake up to change your pads or tampons during the night.  Passing blood clots larger than 1 inch (2.5 cm) in size.  Having bleeding that lasts for more than 7 days.  Having symptoms of low iron levels (anemia), such as tiredness (fatigue) or shortness of breath. How is this diagnosed? This condition may be diagnosed based on:  A physical exam.  Your symptoms and menstrual history.  Tests, such as: ? Blood tests to check if you are pregnant or if you have hormonal changes, a bleeding or thyroid disorder, anemia, or other problems. ? Pap test to check for cancerous changes, infections, or inflammation. ? Endometrial biopsy. This test involves removing a tissue sample from the lining of the uterus  (endometrium) to be examined under a microscope. ? Pelvic ultrasound. This test uses sound waves to create images of your uterus, ovaries, and vagina. The images can show if you have fibroids or other growths. ? Hysteroscopy. For this test, a thin, flexible tube with a light on the end (hysteroscope) is used to look inside your uterus. How is this treated? Treatment may not be needed for this condition. If it is needed, the best treatment for you will depend on:  Whether you need to prevent pregnancy.  Your desire to have children in the future.  The cause and severity of your bleeding.  Your personal preference. Medicine Medicines are the first step in treatment. You may be treated with:  Hormonal birth control methods. These treatments reduce bleeding during your menstrual period. They include: ? Birth control pills. ? Skin patch. ? Vaginal ring. ? Shots (injections) that you get every 3 months. ? Hormonal IUD. ? Implants that go under the skin.  Medicines that thicken the blood and slow bleeding.  Medicines that reduce swelling, such as ibuprofen.  Medicines that contain an artificial (synthetic) hormone called progestin.  Medicines that make the ovaries stop working for a short time.  Iron supplements to treat anemia.   Surgery If medicines do not work, surgery may be done. Surgical options may include:  Dilation and curettage (D&C). In this procedure, your health care provider opens the lowest part of the uterus (cervix) and then scrapes or suctions tissue from the endometrium. This reduces menstrual bleeding.  Operative hysteroscopy. In this procedure,   a hysteroscope is used to view your uterus and help remove polyps that may be causing heavy periods.  Endometrial ablation. This is when various techniques are used to permanently destroy your entire endometrium. After endometrial ablation, most women have little or no menstrual flow. This procedure reduces your ability to  become pregnant.  Endometrial resection. In this procedure, an electrosurgical wire loop is used to remove the endometrium. This procedure reduces your ability to become pregnant.  Hysterectomy. This is surgical removal of your uterus. This is a permanent procedure that stops menstrual periods. Pregnancy is not possible after a hysterectomy. Follow these instructions at home: Medicines  Take over-the-counter and prescription medicines only as told by your health care provider. This includes iron pills.  Do not change or switch medicines without asking your health care provider.  Do not take aspirin or medicines that contain aspirin 1 week before or during your menstrual period. Aspirin may make bleeding worse. Managing constipation Your iron pills may cause constipation. If you are taking prescription iron supplements, you may need to take these actions to prevent or treat constipation:  Drink enough fluid to keep your urine pale yellow.  Take over-the-counter or prescription medicines.  Eat foods that are high in fiber, such as beans, whole grains, and fresh fruits and vegetables.  Limit foods that are high in fat and processed sugars, such as fried or sweet foods. General instructions  If you need to change your sanitary pad or tampon more than once every 2 hours, limit your activity until the bleeding stops.  Eat well-balanced meals, including foods that are high in iron. Foods that have a lot of iron include leafy green vegetables, meat, liver, eggs, and whole-grain breads and cereals.  Do not try to lose weight until the abnormal bleeding has stopped and your blood iron level is back to normal. If you need to lose weight, work with your health care provider to lose weight safely.  Keep all follow-up visits. This is important. Contact a health care provider if:  You soak through a pad or tampon every 1 or 2 hours, and this happens every time you have a period.  You need to use  pads and tampons at the same time because you are bleeding so much.  You have nausea, vomiting, diarrhea, or other problems related to medicines you are taking. Get help right away if:  You soak through more than a pad or tampon in 1 hour.  You pass clots bigger than 1 inch (2.5 cm) wide.  You feel short of breath.  You feel like your heart is beating too fast.  You feel dizzy or you faint.  You feel very weak or tired. Summary  Menorrhagia is a form of abnormal uterine bleeding in which menstrual periods are heavy or last longer than normal.  Treatment may not be needed for this condition. If it is needed, it may include medicines or procedures.  Take over-the-counter and prescription medicines only as told by your health care provider. This includes iron pills.  Get help right away if you have heavy bleeding that soaks through more than a pad or tampon in 1 hour, you pass large clots, or you feel dizzy, short of breath, or very weak or tired. This information is not intended to replace advice given to you by your health care provider. Make sure you discuss any questions you have with your health care provider. Document Revised: 04/18/2020 Document Reviewed: 04/18/2020 Elsevier Patient Education  2021   [employer]        Abnormal Uterine Bleeding  Abnormal uterine bleeding is unusual bleeding from the uterus. It includes bleeding after sex, or bleeding or spotting between menstrual periods. It may also include bleeding that is heavier than normal, menstrual periods that last longer than usual, or bleeding that occurs after menopause. Abnormal uterine bleeding can affect teenagers, women in their reproductive years, pregnant women, and women who have reached menopause. Common causes of abnormal uterine bleeding include:  Pregnancy.  Growths of tissue (polyps).  Benign tumors or growths in the uterus (fibroids). These are not cancer.  Infection.  Cancer.  Too much  or too little of some hormones in the body (hormonal imbalances). Any type of abnormal bleeding should be checked by a health care provider. Many cases are minor and simple to treat, but others may be more serious. Treatment will depend on the cause and severity of the bleeding. Follow these instructions at home: Medicines  Take over-the-counter and prescription medicines only as told by your health care provider.  Tell your health care provider about other medicines that you take. You may be asked to stop taking aspirin or medicines that contain aspirin. These medicines can make bleeding worse.  If you were prescribed iron pills, take them as told by your health care provider. Iron pills help to replace iron that your body loses because of this condition. Managing constipation In cases of severe bleeding, you may be asked to increase your iron intake to treat anemia. This may cause constipation. To prevent or treat constipation, you may need to:  Drink enough fluid to keep your urine pale yellow.  Take over-the-counter or prescription medicines.  Eat foods that are high in fiber, such as beans, whole grains, and fresh fruits and vegetables.  Limit foods that are high in fat and processed sugars, such as fried or sweet foods. General instructions  Monitor your condition for any changes.  Do not use tampons, douche, or have sex until your health care provider says these things are okay.  Change your pads often.  Get regular exams. This includes pelvic exams and cervical cancer screenings. ? It is up to you to get the results of any tests that are done. Ask your health care provider, or the department that is doing the tests, when your results will be ready.  Keep all follow-up visits as told by your health care provider. This is important. Contact a health care provider if you:  Have bleeding that lasts for more than 1 week.  Feel dizzy at times.  Feel nauseous or you  vomit.  Feel light-headed or weak.  Notice any other changes that show that your condition is getting worse. Get help right away if you:  Pass out.  Have bleeding that soaks through a pad every hour.  Have pain in the abdomen.  Have a fever or chills.  Become sweaty or weak.  Pass large blood clots from your vagina. Summary  Abnormal uterine bleeding is unusual bleeding from the uterus.  Any type of abnormal bleeding should be evaluated by a health care provider. Many cases are minor and simple to treat, but others may be more serious.  Treatment will depend on the cause of the bleeding.  Get help right away if you pass out, you have bleeding that soaks through a pad every hour, or you pass large blood clots from your vagina. This information is not intended to replace advice given to you by your  health care provider. Make sure you discuss any questions you have with your health care provider. Document Revised: 04/12/2020 Document Reviewed: 06/08/2019 Elsevier Patient Education  2021 [place].        Dysfunctional Uterine Bleeding Dysfunctional uterine bleeding is abnormal bleeding from the uterus. Dysfunctional uterine bleeding includes:  A menstrual period that comes earlier or later than usual.  A menstrual period that is lighter or heavier than usual, or has large blood clots.  Vaginal bleeding between menstrual periods.  Skipping one or more menstrual periods.  Vaginal bleeding after sex.  Vaginal bleeding after menopause. Follow these instructions at home: Eating and drinking  Eat well-balanced meals. Include foods that are high in iron, such as liver, meat, shellfish, green leafy vegetables, and eggs.  To prevent or treat constipation, your health care provider may recommend that you: ? Drink enough fluid to keep your urine pale yellow. ? Take over-the-counter or prescription medicines. ? Eat foods that are high in fiber, such as beans, whole  grains, and fresh fruits and vegetables. ? Limit foods that are high in fat and processed sugars, such as fried or sweet foods.   Medicines  Take over-the-counter and prescription medicines only as told by your health care provider.  Do not change medicines without talking with your health care provider.  Aspirin or medicines that contain aspirin may make the bleeding worse. Do not take those medicines: ? During the week before your menstrual period. ? During your menstrual period.  If you were prescribed iron pills, take them as told by your health care provider. Iron pills help to replace iron that your body loses because of this condition. Activity  If you need to change your sanitary pad or tampon more than one time every 2 hours: ? Lie in bed with your feet raised (elevated). ? Place a cold pack on your lower abdomen. ? Rest as much as possible until the bleeding stops or slows down.  Do not try to lose weight until the bleeding has stopped and your blood iron level is back to normal. General instructions  For two months, write down: ? When your menstrual period starts. ? When your menstrual period ends. ? When any abnormal vaginal bleeding occurs. ? What problems you notice.  Keep all follow up visits as told by your health care provider. This is important.   Contact a health care provider if you:  Feel light-headed or weak.  Have nausea and vomiting.  Cannot eat or drink without vomiting.  Feel dizzy or have diarrhea while you are taking medicines.  Are taking birth control pills or hormones, and you want to change them or stop taking them. Get help right away if:  You develop a fever or chills.  You need to change your sanitary pad or tampon more than one time per hour.  Your vaginal bleeding becomes heavier, or your flow contains clots more often.  You develop pain in your abdomen.  You lose consciousness.  You develop a rash. Summary  Dysfunctional  uterine bleeding is abnormal bleeding from the uterus.  It includes menstrual bleeding of abnormal duration, volume, or regularity.  Bleeding after sex and after menopause are also considered dysfunctional uterine bleeding. This information is not intended to replace advice given to you by your health care provider. Make sure you discuss any questions you have with your health care provider. Document Revised: 01/14/2018 Document Reviewed: 01/14/2018 Elsevier Patient Education  2021 [employer].  For your heavy menses and cramping, take EITHER Advil or Aleve continuously for the first 2-3days of your cycle. This should lessen your bleeding and pain. If there is not sufficient improvement in 60mo, RTC for additional treatment options. You can purchase these medications over the counter without a prescription. Be sure not to exceed the amount recommended in the instructions on the medication bottle.        Contraception Choices - [email or web address] Contraception, also called birth control, refers to methods or devices that prevent pregnancy. Hormonal methods Contraceptive implant A contraceptive implant is a thin, plastic tube that contains a hormone that prevents pregnancy. It is different from an intrauterine device (IUD). It is inserted into the upper part of the arm by a health care provider. Implants can be effective for up to 3 years. Progestin-only injections Progestin-only injections are injections of progestin, a synthetic form of the hormone progesterone. They are given every 3 months by a health care provider. Birth control pills Birth control pills are pills that contain hormones that prevent pregnancy. They must be taken once a day, preferably at the same time each day. A prescription is needed to use this method of contraception. Birth control patch The birth control patch contains hormones that prevent pregnancy. It is placed on the skin and must be changed once a week  for three weeks and removed on the fourth week. A prescription is needed to use this method of contraception. Vaginal ring A vaginal ring contains hormones that prevent pregnancy. It is placed in the vagina for three weeks and removed on the fourth week. After that, the process is repeated with a new ring. A prescription is needed to use this method of contraception. Emergency contraceptive Emergency contraceptives prevent pregnancy after unprotected sex. They come in pill form and can be taken up to 5 days after sex. They work best the sooner they are taken after having sex. Most emergency contraceptives are available without a prescription. This method should not be used as your only form of birth control.   Barrier methods Female condom A female condom is a thin sheath that is worn over the penis during sex. Condoms keep sperm from going inside a woman's body. They can be used with a sperm-killing substance (spermicide) to increase their effectiveness. They should be thrown away after one use. Female condom A female condom is a soft, loose-fitting sheath that is put into the vagina before sex. The condom keeps sperm from going inside a woman's body. They should be thrown away after one use. Diaphragm A diaphragm is a soft, dome-shaped barrier. It is inserted into the vagina before sex, along with a spermicide. The diaphragm blocks sperm from entering the uterus, and the spermicide kills sperm. A diaphragm should be left in the vagina for 6-8 hours after sex and removed within 24 hours. A diaphragm is prescribed and fitted by a health care provider. A diaphragm should be replaced every 1-2 years, after giving birth, after gaining more than 15 lb (6.8 kg), and after pelvic surgery. Cervical cap A cervical cap is a round, soft latex or plastic cup that fits over the cervix. It is inserted into the vagina before sex, along with spermicide. It blocks sperm from entering the uterus. The cap should be left in  place for 6-8 hours after sex and removed within 48 hours. A cervical cap must be prescribed and fitted by a health care provider. It should be replaced every 2 years. Sponge A sponge is  a soft, circular piece of polyurethane foam with spermicide in it. The sponge helps block sperm from entering the uterus, and the spermicide kills sperm. To use it, you make it wet and then insert it into the vagina. It should be inserted before sex, left in for at least 6 hours after sex, and removed and thrown away within 30 hours. Spermicides Spermicides are chemicals that kill or block sperm from entering the cervix and uterus. They can come as a cream, jelly, suppository, foam, or tablet. A spermicide should be inserted into the vagina with an applicator at least 32-02 minutes before sex to allow time for it to work. The process must be repeated every time you have sex. Spermicides do not require a prescription.   Intrauterine contraception Intrauterine device (IUD) An IUD is a T-shaped device that is put in a woman's uterus. There are two types:  Hormone IUD.This type contains progestin, a synthetic form of the hormone progesterone. This type can stay in place for 3-5 years.  Copper IUD.This type is wrapped in copper wire. It can stay in place for 10 years. Permanent methods of contraception Female tubal ligation In this method, a woman's fallopian tubes are sealed, tied, or blocked during surgery to prevent eggs from traveling to the uterus. Hysteroscopic sterilization In this method, a small, flexible insert is placed into each fallopian tube. The inserts cause scar tissue to form in the fallopian tubes and block them, so sperm cannot reach an egg. The procedure takes about 3 months to be effective. Another form of birth control must be used during those 3 months. Female sterilization This is a procedure to tie off the tubes that carry sperm (vasectomy). After the procedure, the man can still ejaculate fluid  (semen). Another form of birth control must be used for 3 months after the procedure. Natural planning methods Natural family planning In this method, a couple does not have sex on days when the woman could become pregnant. Calendar method In this method, the woman keeps track of the length of each menstrual cycle, identifies the days when pregnancy can happen, and does not have sex on those days. Ovulation method In this method, a couple avoids sex during ovulation. Symptothermal method This method involves not having sex during ovulation. The woman typically checks for ovulation by watching changes in her temperature and in the consistency of cervical mucus. Post-ovulation method In this method, a couple waits to have sex until after ovulation. Where to find more information  Centers for Disease Control and Prevention: http://www.wolf.info/ Summary  Contraception, also called birth control, refers to methods or devices that prevent pregnancy.  Hormonal methods of contraception include implants, injections, pills, patches, vaginal rings, and emergency contraceptives.  Barrier methods of contraception can include female condoms, female condoms, diaphragms, cervical caps, sponges, and spermicides.  There are two types of IUDs (intrauterine devices). An IUD can be put in a woman's uterus to prevent pregnancy for 3-5 years.  Permanent sterilization can be done through a procedure for males and females. Natural family planning methods involve nothaving sex on days when the woman could become pregnant. This information is not intended to replace advice given to you by your health care provider. Make sure you discuss any questions you have with your health care provider. Document Revised: 01/10/2020 Document Reviewed: 01/10/2020 Elsevier Patient Education  [place].

## 2020-11-01 NOTE — Progress Notes (Signed)
History:  Gina Burke is a 25 y.o. No obstetric history on file. who presents to clinic today for heavy periods. Patient reports the first instance of heavy/painful periods was with her menses at the end of February. Prior to this episode, pt reports it was normal for her to experience pain relieved by ibuprofen for the first 1-2 days of her period, normally lasting 4-5 days, using a maximum of 6 pads on her heaviest day. Patient reports her period in February lasted for 14 days and was painful for all 14 days, requiring two trips to Urgent Care/ED for pain medication. iStat was negative on her first visit to the ED. Patient reports passing large clots and having to wear 5-6 diapers per day as she was just bleeding through the pads. Patient reports while sleeping she was needing to put a towel under her to catch the blood. Patient reports she has periods once per month.  Patient reports she is not taking any medications at this time, but was taking Megace which was prescribed by the ED and the bleeding stopped 4 days after she started taking that.   Patient reports NKDA. Patient is sexually active at this time and is not using anything for birth control except for condoms occasionally.  Patient reports she does not have any medical history or history of surgery.  The following portions of the patient's history were reviewed and updated as appropriate: allergies, current medications, family history, past medical history, social history, past surgical history and problem list.  Review of Systems:  Review of Systems  Constitutional: Negative for fever and weight loss.  Respiratory: Negative for shortness of breath.   Cardiovascular: Negative for chest pain.  Gastrointestinal: Negative for abdominal pain, nausea and vomiting.  Genitourinary: Negative for dysuria.  GU: vaginal bleeding    Objective:  Physical Exam BP 115/75 (BP Location: Right Arm, Patient Position: Sitting, Cuff Size:  Normal)   Pulse 88   Ht 5\' 4"  (1.626 m)   Wt 125 lb (56.7 kg)   LMP 10/16/2020 (Approximate)   BMI 21.46 kg/m    Physical Exam Vitals and nursing note reviewed. Exam conducted with a chaperone present.  Constitutional:      General: She is not in acute distress.    Appearance: Normal appearance. She is not ill-appearing, toxic-appearing or diaphoretic.  HENT:     Head: Normocephalic and atraumatic.  Pulmonary:     Effort: Pulmonary effort is normal.  Genitourinary:    General: Normal vulva.     Labia:        Right: No rash, tenderness or lesion.        Left: No rash, tenderness or lesion.      Vagina: Normal.     Cervix: Normal.     Uterus: Normal.      Adnexa: Right adnexa normal and left adnexa normal.  Neurological:     Mental Status: She is alert and oriented to person, place, and time.  Psychiatric:        Mood and Affect: Mood normal.        Behavior: Behavior normal.        Thought Content: Thought content normal.        Judgment: Judgment normal.    Labs and Imaging No results found for this or any previous visit (from the past 24 hour(s)).  No results found.   Assessment & Plan:  1. Excessive bleeding in premenopausal period - pt will read about birth control options  and let [place] know her decision or if she would like further information/counseling - CBC - US PELVIC COMPLETE WITH TRANSVAGINAL; Future  2. Cervical cancer screening - Cytology - PAP( [place])  3. Screening for STD (sexually transmitted disease) - Cervicovaginal ancillary only( [place]) - RPR+HBsAg+HCVAb+...    Approximately 10 minutes of total time was spent with this patient on counseling and coordination of care, 5 min on exam.  Keola Heninger, Gerrie Nordmann, NP 11/01/2020 1:40 PM

## 2020-11-01 NOTE — Progress Notes (Signed)
Pt presents today as new patient for gyn exam and to establish care. Pt also c/o heavy bleeding with the last cycle. LMP: 10/16/20 BC: none currently. UPT at hospital on 10/23/20 was negative. Pt states all her cycles are painful and heavy the first 2 days and then go to a moderate flow. Pt states this last cycle was 14 days with heavy clotting.

## 2020-11-02 LAB — CBC
Hematocrit: 38 % (ref 34.0–46.6)
Hemoglobin: 12.8 g/dL (ref 11.1–15.9)
MCH: 31.1 pg (ref 26.6–33.0)
MCHC: 33.7 g/dL (ref 31.5–35.7)
MCV: 92 fL (ref 79–97)
Platelets: 218 10*3/uL (ref 150–450)
RBC: 4.12 x10E6/uL (ref 3.77–5.28)
RDW: 12.3 % (ref 11.7–15.4)
WBC: 4.3 10*3/uL (ref 3.4–10.8)

## 2020-11-02 LAB — RPR+HBSAG+HCVAB+...
HIV Screen 4th Generation wRfx: NONREACTIVE
Hep C Virus Ab: <0.1 s/co ratio (ref 0.0–0.9)
Hepatitis B Surface Ag: NEGATIVE
RPR Ser Ql: NONREACTIVE

## 2020-11-02 LAB — CERVICOVAGINAL ANCILLARY ONLY
Chlamydia: NEGATIVE
Comment: NEGATIVE
Comment: NEGATIVE
Comment: NORMAL
Neisseria Gonorrhea: NEGATIVE
Trichomonas: NEGATIVE

## 2020-11-03 LAB — CYTOLOGY - PAP
Comment: NEGATIVE
Diagnosis: NEGATIVE
High risk HPV: NEGATIVE

## 2020-11-05 DIAGNOSIS — Z20822 Contact with and (suspected) exposure to covid-19: Secondary | ICD-10-CM | POA: Diagnosis not present

## 2020-11-14 ENCOUNTER — Ambulatory Visit: Payer: BC Managed Care – PPO | Admitting: Obstetrics

## 2020-11-17 ENCOUNTER — Ambulatory Visit
Admission: RE | Admit: 2020-11-17 | Discharge: 2020-11-17 | Disposition: A | Discharge status: Home / Self Care | Payer: BC Managed Care – PPO | Source: Ambulatory Visit | Attending: Women's Health | Admitting: Women's Health

## 2020-11-17 DIAGNOSIS — N924 Excessive bleeding in the premenopausal period: Secondary | ICD-10-CM

## 2020-11-17 DIAGNOSIS — N858 Other specified noninflammatory disorders of uterus: Secondary | ICD-10-CM | POA: Diagnosis not present

## 2020-11-17 DIAGNOSIS — N920 Excessive and frequent menstruation with regular cycle: Secondary | ICD-10-CM | POA: Diagnosis not present

## 2020-11-20 ENCOUNTER — Telehealth: Payer: Self-pay | Admitting: *Deleted

## 2020-11-20 NOTE — Telephone Encounter (Signed)
Call received from [facility] @ [place]. He stated that he was calling to verify that ultrasound results for this pt had been received. Per chart review, pt receives care @ [place] and had last visit w/Nicole Nugent, NP on 3/16 who ordered the ultrasound. This telephone encounter routed to [place].

## 2020-11-21 ENCOUNTER — Telehealth: Payer: Self-pay | Admitting: Women's Health

## 2020-11-21 NOTE — Telephone Encounter (Signed)
Patient called and identified by two identifiers. Patient informed of [place] results and need for EndoBx in office per consultation with Dr. Nehemiah Settle. Patient also informed that EndoBx might not be last test that needs to be performed and additional testing or imaging might be necessary after the fact. Patient advised I will send a message to the office to schedule her for EndoBx. Patient asked if she had any questions and patient verbalizes that the night of her appointment she started bleeding again and has been passing large clots and having heavy bleeding daily, reports 20lb weight loss and feeling like she is going to pass out. Patient advised with these symptoms that she should be seen immediately in the Emergency Department to assess blood loss. Patient verbalizes understanding and agrees with plan and states she will go to ED today.  Clarisa Fling, NP  1:48 PM 11/21/2020

## 2020-11-23 ENCOUNTER — Other Ambulatory Visit: Payer: Self-pay

## 2020-11-23 ENCOUNTER — Emergency Department (HOSPITAL_COMMUNITY)
Admission: EM | Admit: 2020-11-23 | Discharge: 2020-11-24 | Disposition: A | Discharge status: Home / Self Care | Payer: BC Managed Care – PPO | Attending: Emergency Medicine | Admitting: Emergency Medicine

## 2020-11-23 DIAGNOSIS — R5383 Other fatigue: Secondary | ICD-10-CM | POA: Insufficient documentation

## 2020-11-23 DIAGNOSIS — R42 Dizziness and giddiness: Secondary | ICD-10-CM | POA: Diagnosis not present

## 2020-11-23 DIAGNOSIS — N939 Abnormal uterine and vaginal bleeding, unspecified: Secondary | ICD-10-CM | POA: Diagnosis not present

## 2020-11-23 DIAGNOSIS — N938 Other specified abnormal uterine and vaginal bleeding: Secondary | ICD-10-CM | POA: Diagnosis not present

## 2020-11-23 NOTE — ED Provider Notes (Signed)
MSE was initiated and I personally evaluated the patient and placed orders (if any) at  11:46 PM on November 23, 2020.  Patient with heavy vaginal bleeding for the past 4 weeks. Has been seen by ED, GYN, reports normal lab and ultrasound studies. Took Megace and bleeding stopped for 2 days then returned. Now having positional lightheadedness and weakness.   Today's Vitals   11/23/20 2329  BP: 109/82  Pulse: 96  Resp: 16  Temp: 97.8 F (36.6 C)  TempSrc: Oral  SpO2: 100%   There is no height or weight on file to calculate BMI.  VSS Conjunctival pallor Tender across lower abdomen.  The patient appears stable so that the remainder of the MSE may be completed by another provider.   Charlann Lange, PA-C 11/23/20 2347    Fatima Blank, MD 11/24/20 [phone]

## 2020-11-23 NOTE — ED Triage Notes (Addendum)
Pt reports she been having  period for over a month and states her period has been very heavy and causing weakness. Pt states she been to her OB and they had perform an [place] and told pt to come to the ED for further eval to stop the bleeding

## 2020-11-24 LAB — COMPREHENSIVE METABOLIC PANEL
ALT: 15 U/L (ref 0–44)
AST: 21 U/L (ref 15–41)
Albumin: 4 g/dL (ref 3.5–5.0)
Alkaline Phosphatase: 43 U/L (ref 38–126)
Anion gap: 7 (ref 5–15)
BUN: 6 mg/dL (ref 6–20)
CO2: 24 mmol/L (ref 22–32)
Calcium: 9.2 mg/dL (ref 8.9–10.3)
Chloride: 105 mmol/L (ref 98–111)
Creatinine, Ser: 0.7 mg/dL (ref 0.44–1.00)
GFR, Estimated: >60 mL/min (ref >60)
Glucose, Bld: 99 mg/dL (ref 70–99)
Potassium: 3.8 mmol/L (ref 3.5–5.1)
Sodium: 136 mmol/L (ref 135–145)
Total Bilirubin: 0.4 mg/dL (ref 0.3–1.2)
Total Protein: 6.8 g/dL (ref 6.5–8.1)

## 2020-11-24 LAB — ABO/RH: ABO/RH(D): O POS

## 2020-11-24 LAB — I-STAT BETA HCG BLOOD, ED (MC, WL, AP ONLY): I-stat hCG, quantitative: <5 m[IU]/mL (ref <5)

## 2020-11-24 LAB — CBC
HCT: 29.7 % — ABNORMAL LOW (ref 36.0–46.0)
Hemoglobin: 9.6 g/dL — ABNORMAL LOW (ref 12.0–15.0)
MCH: 29.8 pg (ref 26.0–34.0)
MCHC: 32.3 g/dL (ref 30.0–36.0)
MCV: 92.2 fL (ref 80.0–100.0)
Platelets: 246 10*3/uL (ref 150–400)
RBC: 3.22 MIL/uL — ABNORMAL LOW (ref 3.87–5.11)
RDW: 11.9 % (ref 11.5–15.5)
WBC: 4 10*3/uL (ref 4.0–10.5)
nRBC: 0 % (ref 0.0–0.2)

## 2020-11-24 LAB — TYPE AND SCREEN
ABO/RH(D): O POS
Antibody Screen: NEGATIVE

## 2020-11-24 MED ORDER — MEGESTROL ACETATE 40 MG PO TABS
40.0000 mg | ORAL_TABLET | Freq: Once | ORAL | Status: AC
  Administered 2020-11-24: 40 mg via ORAL
  Filled 2020-11-24: qty 1

## 2020-11-24 MED ORDER — MEGESTROL ACETATE 40 MG PO TABS: 40.0000 mg | ORAL_TABLET | Freq: Two times a day (BID) | ORAL | 0 refills | Status: DC

## 2020-11-24 NOTE — ED Provider Notes (Signed)
[facility] EMERGENCY DEPARTMENT Provider Note  CSN: [id number] Arrival date & time: 11/23/20 2315  Chief Complaint(s) Vaginal Bleeding  HPI Gina Burke is a 25 y.o. female seen recently for abnormal uterine bleeding and followed by OB here for persistent bleeding.  Patient was initially seen back in March and started on Megace which did provide cessation of her bleeding until she stopped taking it.  She was seen by the OB after that and had a ultrasound showing a 2.9 cm lesion in the uterus.  GC/chlamydia and other work-up was negative.  Patient is endorsing generalized fatigue and lightheadedness.  No chest pain or shortness of breath.  She is endorsing abdominal cramping.  Was not started on any birth control but is scheduled to have a biopsy.  HPI  Past Medical History Past Medical History:  Diagnosis Date  . Medical history non-contributory    Patient Active Problem List   Diagnosis Date Noted  . Refugee health examination 02/09/2013  . Dysmenorrhea 02/09/2013  . Pediatric body mass index (BMI) of 5th percentile to less than 85th percentile for age 49/24/2014   Home Medication(s) Prior to Admission medications   Medication Sig Start Date End Date Taking? Authorizing Provider  megestrol (MEGACE) 40 MG tablet Take 1 tablet (40 mg total) by mouth 2 (two) times daily. 11/24/20 12/24/20 Yes [place] Yearby, Grayce Sessions, MD  acetaminophen (TYLENOL) 500 MG tablet Take 500 mg by mouth every 6 (six) hours as needed.    [provider]  esomeprazole (NEXIUM) 40 MG capsule Take 1 capsule (40 mg total) by mouth daily. 06/17/16   Robyn Haber, MD  ondansetron (ZOFRAN ODT) 4 MG disintegrating tablet Take 1 tablet (4 mg total) by mouth every 8 (eight) hours as needed. 10/21/20   McDonald, Laymond Purser, PA-C                                                                                                                                    Past Surgical History Past Surgical History:   Procedure Laterality Date  . NO PAST SURGERIES     Family History Family History  Problem Relation Age of Onset  . Heart disease Brother     Social History Social History   Tobacco Use  . Smoking status: Never Smoker  . Smokeless tobacco: Never Used  Substance Use Topics  . Alcohol use: No  . Drug use: No   Allergies Patient has no known allergies.  Review of Systems Review of Systems All other systems are reviewed and are negative for acute change except as noted in the HPI  Physical Exam Vital Signs  I have reviewed the triage vital signs BP 109/82 (BP Location: Left Arm)   Pulse 96   Temp 97.8 F (36.6 C) (Oral)   Resp 16   SpO2 100%   Physical Exam Vitals reviewed. Exam conducted with a chaperone present.  Constitutional:  General: She is not in acute distress.    Appearance: She is well-developed. She is not diaphoretic.  HENT:     Head: Normocephalic and atraumatic.     Right Ear: External ear normal.     Left Ear: External ear normal.     Nose: Nose normal.  Eyes:     General: No scleral icterus.    Conjunctiva/sclera: Conjunctivae normal.  Neck:     Trachea: Phonation normal.  Cardiovascular:     Rate and Rhythm: Normal rate and regular rhythm.  Pulmonary:     Effort: Pulmonary effort is normal. No respiratory distress.     Breath sounds: No stridor.  Abdominal:     General: There is no distension.     Tenderness: There is no abdominal tenderness.  Genitourinary:    Cervix: Cervical bleeding present. No discharge, friability, lesion or erythema.  Musculoskeletal:        General: Normal range of motion.     Cervical back: Normal range of motion.  Neurological:     Mental Status: She is alert and oriented to person, place, and time.  Psychiatric:        Behavior: Behavior normal.     ED Results and Treatments Labs (all labs ordered are listed, but only abnormal results are displayed) Labs Reviewed  CBC - Abnormal; Notable for the  following components:      Result Value   RBC 3.22 (*)    Hemoglobin 9.6 (*)    HCT 29.7 (*)    All other components within normal limits  COMPREHENSIVE METABOLIC PANEL  I-STAT BETA HCG BLOOD, ED ([facility], [facility], [facility] ONLY)  TYPE AND SCREEN  ABO/RH                                                                                                                         EKG  EKG Interpretation  Date/Time:    Ventricular Rate:    PR Interval:    QRS Duration:   QT Interval:    QTC Calculation:   R Axis:     Text Interpretation:        Radiology No results found.  Pertinent labs & imaging results that were available during my care of the patient were reviewed by me and considered in my medical decision making (see chart for details).  Medications Ordered in ED Medications  megestrol (MEGACE) tablet 40 mg (has no administration in time range)  Procedures Procedures  (including critical care time)  Medical Decision Making / ED Course I have reviewed the nursing notes for this encounter and the patient's prior records (if available in EHR or on provided paperwork).   Gina Burke was evaluated in Emergency Department on 11/24/2020 for the symptoms described in the history of present illness. She was evaluated in the context of the global COVID-19 pandemic, which necessitated consideration that the patient might be at risk for infection with the SARS-CoV-2 virus that causes COVID-19. Institutional protocols and algorithms that pertain to the evaluation of patients at risk for COVID-19 are in a state of rapid change based on information released by regulatory bodies including the CDC and federal and state organizations. These policies and algorithms were followed during the patient's care in the ED.  Patient with 3g drop in Hb, but HDS. Discussed with Dr. Elgie Congo,  Ob/Gyn, who recommended longer Rx for Megace. Gyn follow up as scheduled.      Final Clinical Impression(s) / ED Diagnoses Final diagnoses:  Dysfunctional uterine bleeding   The patient appears reasonably screened and/or stabilized for discharge and I doubt any other medical condition or other [facility] requiring further screening, evaluation, or treatment in the ED at this time prior to discharge. Safe for discharge with strict return precautions.  Disposition: Discharge  Condition: Good  I have discussed the results, Dx and Tx plan with the patient/family who expressed understanding and agree(s) with the plan. Discharge instructions discussed at length. The patient/family was given strict return precautions who verbalized understanding of the instructions. No further questions at time of discharge.    ED Discharge Orders         Ordered    megestrol (MEGACE) 40 MG tablet  2 times daily        11/24/20 0231            Follow Up: Clarisa Fling, NP [place] [id number] [phone]  Go to  as scheduled      This chart was dictated using voice recognition software.  Despite best efforts to proofread,  errors can occur which can change the documentation meaning.   Fatima Blank, MD 11/24/20 [phone]

## 2020-12-11 ENCOUNTER — Telehealth: Payer: Self-pay

## 2020-12-11 NOTE — Telephone Encounter (Signed)
Spoke with pt to get clarity on her FMLA request.  She did not report to work [employer], Sat, and Sunday due to excessive bleeding and cramping.  She is out of PAL time and needs FMLA to protect her job. Pt is also requesting intermittent leave.  Patient has an appointment tomorrow and agrees to discuss the plan with the provider.

## 2020-12-12 ENCOUNTER — Ambulatory Visit (INDEPENDENT_AMBULATORY_CARE_PROVIDER_SITE_OTHER): Payer: BC Managed Care – PPO | Admitting: Obstetrics and Gynecology

## 2020-12-12 ENCOUNTER — Encounter: Payer: Self-pay | Admitting: Obstetrics and Gynecology

## 2020-12-12 ENCOUNTER — Other Ambulatory Visit: Payer: Self-pay

## 2020-12-12 VITALS — BP 124/82 | HR 130 | Ht 64.0 in | Wt 119.0 lb

## 2020-12-12 DIAGNOSIS — N939 Abnormal uterine and vaginal bleeding, unspecified: Secondary | ICD-10-CM

## 2020-12-12 MED ORDER — MEGESTROL ACETATE 40 MG PO TABS: 40.0000 mg | ORAL_TABLET | Freq: Two times a day (BID) | ORAL | 3 refills | Status: DC

## 2020-12-12 MED ORDER — FERROUS SULFATE 325 (65 FE) MG PO TABS: 325.0000 mg | ORAL_TABLET | Freq: Two times a day (BID) | ORAL | 1 refills | Status: DC

## 2020-12-12 NOTE — Progress Notes (Signed)
25 yo P0 here for further evaluation of AUB. Patient with history of heavy menses and dysmenorrhea since February. Patient reports improvement in her bleeding with Megace.   Past Medical History:  Diagnosis Date  . Medical history non-contributory    Past Surgical History:  Procedure Laterality Date  . NO PAST SURGERIES     Family History  Problem Relation Age of Onset  . Heart disease Brother    Social History   Tobacco Use  . Smoking status: Never Smoker  . Smokeless tobacco: Never Used  Substance Use Topics  . Alcohol use: No  . Drug use: No   ROS See pertinent in HPI. All other systems reviewed and non contributory Blood pressure 124/82, pulse (!) 130, height 5\' 4"  (1.626 m), weight 119 lb (54 kg), last menstrual period 10/16/2020.  GENERAL: Well-developed, well-nourished female in no acute distress.  NEURO: alert and oriented x 3  US PELVIC COMPLETE WITH TRANSVAGINAL  Result Date: 11/17/2020 CLINICAL DATA:  Heavy menses with continued bleeding since 10/16/2020 EXAM: TRANSABDOMINAL AND TRANSVAGINAL ULTRASOUND OF PELVIS TECHNIQUE: Both transabdominal and transvaginal ultrasound examinations of the pelvis were performed. Transabdominal technique was performed for global imaging of the pelvis including uterus, ovaries, adnexal regions, and pelvic cul-de-sac. It was necessary to proceed with endovaginal exam following the transabdominal exam to visualize the uterus, endometrium, and ovaries. COMPARISON:  None FINDINGS: Uterus Measurements: 8.9 x 3.5 x 4.2 cm = volume: 68.2 mL. Anteverted uterus. No discernible uterine lesion or fibroid. Endometrium Thickness: 7.9 mm in double stripe endometrial thickness. There is a focal, heterogeneous structure measuring 2.7 x 1.8 x 2.9 cm within the endometrial canal demonstrating some hyperemic color flow (image 50/77). Right ovary Measurements: 3.0 x 1.8 x 1.9 cm = volume: 5.3 mL. Normal anechoic follicles. No concerning adnexal lesion. Normal  color flow in the ovary. Left ovary Measurements: 3.0 x 1.8 x 1.7 cm = volume: 4.9 mL. Multiple anechoic follicles. No concerning adnexal lesion. Normal color flow in the ovaries. Other findings Trace anechoic free fluid in the posterior cul-de-sac, nonspecific though often physiologic in a reproductive age female. IMPRESSION: 1. Heterogeneous 2.9 cm structure in the endometrial canal with some hyperemic color flow. Could reflect a submucosal fibroid large endometrial polyp or more insidious endometrial lesion. Gynecologic referral is recommended with further evaluation with sonohysterography or hysteroscopy. 2. Trace anechoic free fluid in the posterior cul-de-sac, nonspecific though can be physiologic in a age female. 3. Otherwise unremarkable pelvic ultrasound. These results will be called to the ordering clinician or representative by the Radiologist Assistant, and communication documented in the PACS or [employer]. Electronically Signed   By: Lovena Le M.D.   On: 11/17/2020 23:46     A/P 25 yo with AUB - reviewed ultrasound results with the patient - Discussed benefits of D&C with operative hysteroscopy to further evaluate endometrial lesion. Risks, benefits and alternatives were explained including but not limited to risks of bleeding, infection and damage to adjacent organs.  - Patient is ready to proceed and desires to have it done ASAP as patient no longer has vacation days and needs to take short term disability until post op clearance  - Patient

## 2020-12-12 NOTE — Patient Instructions (Signed)

## 2020-12-12 NOTE — Progress Notes (Signed)
Pt is here today for menorrhagia. She has been having constant bleeding since 10/16/2020. Pt states she is losing weight without trying. She is dizzy and tired all the time. She feels like she is going to faint, weak, and wants to sleep all the time. Hgb on 11/23/20 was 9.6. She is not currently taking an iron supplement.

## 2020-12-13 ENCOUNTER — Telehealth: Payer: Self-pay | Admitting: *Deleted

## 2020-12-13 ENCOUNTER — Encounter: Payer: Self-pay | Admitting: *Deleted

## 2020-12-13 NOTE — Telephone Encounter (Signed)
Call to patient. Voice mail has name confirmation. Left message procedure scheduled for 12-20-20 @ 115 pm and arrive at 1115 am. Letter sent through My Chart and by mail.

## 2020-12-13 NOTE — Telephone Encounter (Signed)
Return call from patient. Advised surgery date and arrival time with Dr Rip Harbour per patient request for first available time. Patient agreeable. Aware to expect call from Pre-Op nurse and letter to follow in mail.   Encounter closed.

## 2020-12-14 ENCOUNTER — Encounter (HOSPITAL_BASED_OUTPATIENT_CLINIC_OR_DEPARTMENT_OTHER): Payer: Self-pay | Admitting: Obstetrics and Gynecology

## 2020-12-14 ENCOUNTER — Other Ambulatory Visit: Payer: Self-pay

## 2020-12-14 NOTE — Progress Notes (Signed)
Pre op phone call done with pt. Gina Burke states that since starting megace her bleeding has slowed down. She is still c/o dizziness and feeling tired and weak. Encouraged to increase fluid intake and to start the iron that was also ordered. Precautions given and pt encouraged to go to the ED if symptoms worsen.  PT verbalized understanding.  Reviewed with Dr. Sabra Heck- will bring pt in for PAT appt to check VS (last HR 130 in office), EKG and repeat CBC.  Surgeon's orders not in.

## 2020-12-18 ENCOUNTER — Other Ambulatory Visit (HOSPITAL_COMMUNITY)
Admission: RE | Admit: 2020-12-18 | Discharge: 2020-12-18 | Disposition: A | Discharge status: Home / Self Care | Payer: BC Managed Care – PPO | Source: Ambulatory Visit | Attending: Obstetrics and Gynecology | Admitting: Obstetrics and Gynecology

## 2020-12-18 ENCOUNTER — Encounter (HOSPITAL_BASED_OUTPATIENT_CLINIC_OR_DEPARTMENT_OTHER)
Admission: RE | Admit: 2020-12-18 | Discharge: 2020-12-18 | Disposition: A | Discharge status: Home / Self Care | Payer: BC Managed Care – PPO | Source: Ambulatory Visit | Attending: Obstetrics and Gynecology | Admitting: Obstetrics and Gynecology

## 2020-12-18 DIAGNOSIS — R9389 Abnormal findings on diagnostic imaging of other specified body structures: Secondary | ICD-10-CM | POA: Diagnosis not present

## 2020-12-18 DIAGNOSIS — Z01812 Encounter for preprocedural laboratory examination: Secondary | ICD-10-CM | POA: Insufficient documentation

## 2020-12-18 DIAGNOSIS — N840 Polyp of corpus uteri: Secondary | ICD-10-CM | POA: Diagnosis not present

## 2020-12-18 DIAGNOSIS — Z20822 Contact with and (suspected) exposure to covid-19: Secondary | ICD-10-CM | POA: Diagnosis not present

## 2020-12-18 DIAGNOSIS — N939 Abnormal uterine and vaginal bleeding, unspecified: Secondary | ICD-10-CM | POA: Diagnosis not present

## 2020-12-18 LAB — CBC
HCT: 30.5 % — ABNORMAL LOW (ref 36.0–46.0)
Hemoglobin: 9.3 g/dL — ABNORMAL LOW (ref 12.0–15.0)
MCH: 26.6 pg (ref 26.0–34.0)
MCHC: 30.5 g/dL (ref 30.0–36.0)
MCV: 87.1 fL (ref 80.0–100.0)
Platelets: 324 10*3/uL (ref 150–400)
RBC: 3.5 MIL/uL — ABNORMAL LOW (ref 3.87–5.11)
RDW: 16.2 % — ABNORMAL HIGH (ref 11.5–15.5)
WBC: 5.2 10*3/uL (ref 4.0–10.5)
nRBC: 0 % (ref 0.0–0.2)

## 2020-12-18 LAB — TYPE AND SCREEN
ABO/RH(D): O POS
Antibody Screen: NEGATIVE

## 2020-12-18 LAB — POCT PREGNANCY, URINE: Preg Test, Ur: NEGATIVE

## 2020-12-18 NOTE — H&P (Signed)
Gina Burke is an 25 y.o. female with a H/O AUB and thicken endometrium on U/S. Hysteroscopy D & C has been recommended to pt.   Menstrual History: Menarche age: 12 Patient's last menstrual period was 10/16/2020.    Past Medical History:  Diagnosis Date  . Abnormal uterine bleeding (AUB) 10/16/2020  . Anemia   . Medical history non-contributory     Past Surgical History:  Procedure Laterality Date  . NO PAST SURGERIES      Family History  Problem Relation Age of Onset  . Heart disease Brother     Social History:  reports that she has never smoked. She has never used smokeless tobacco. She reports current alcohol use. She reports that she does not use drugs.  Allergies: No Known Allergies  No medications prior to admission.    Review of Systems  Constitutional: Negative.   Respiratory: Negative.   Cardiovascular: Negative.   Gastrointestinal: Negative.   Genitourinary: Positive for menstrual problem.    Height 5\' 4"  (1.626 m), weight 54 kg, last menstrual period 10/16/2020. Physical Exam Constitutional:      Appearance: Normal appearance.  Cardiovascular:     Rate and Rhythm: Normal rate.     Heart sounds: Normal heart sounds.  Pulmonary:     Effort: Pulmonary effort is normal.     Breath sounds: Normal breath sounds.  Abdominal:     General: Bowel sounds are normal.     Palpations: Abdomen is soft.  Genitourinary:    Comments: Deferred to OR Neurological:     Mental Status: She is alert.     No results found for this or any previous visit (from the past 24 hour(s)).  No results found.  Assessment/Plan: AUB Thicken endometrium on U/S.  Hysteroscopy D & C reviewed with pt. R/B/Post care has been reviewed. Pt agrees to proceed.  Chancy Milroy 12/18/2020, 9:51 AM

## 2020-12-18 NOTE — Progress Notes (Signed)

## 2020-12-18 NOTE — Progress Notes (Signed)
Urine pregnancy screen completed today. Negative.

## 2020-12-19 LAB — SARS CORONAVIRUS 2 (TAT 6-24 HRS): SARS Coronavirus 2: NEGATIVE

## 2020-12-19 NOTE — Progress Notes (Signed)
Hemoglobin-9.3, Dr. Doroteo Glassman aware and will proceed with surgery as scheduled.

## 2020-12-20 ENCOUNTER — Encounter (HOSPITAL_BASED_OUTPATIENT_CLINIC_OR_DEPARTMENT_OTHER): Payer: Self-pay | Admitting: Obstetrics and Gynecology

## 2020-12-20 ENCOUNTER — Ambulatory Visit (HOSPITAL_BASED_OUTPATIENT_CLINIC_OR_DEPARTMENT_OTHER): Payer: BC Managed Care – PPO | Admitting: Anesthesiology

## 2020-12-20 ENCOUNTER — Other Ambulatory Visit: Payer: Self-pay

## 2020-12-20 ENCOUNTER — Encounter (HOSPITAL_BASED_OUTPATIENT_CLINIC_OR_DEPARTMENT_OTHER)
Admission: RE | Disposition: A | Discharge status: Home / Self Care | Payer: Self-pay | Source: Home / Self Care | Attending: Obstetrics and Gynecology

## 2020-12-20 ENCOUNTER — Ambulatory Visit (HOSPITAL_BASED_OUTPATIENT_CLINIC_OR_DEPARTMENT_OTHER)
Admission: RE | Admit: 2020-12-20 | Discharge: 2020-12-20 | Disposition: A | Discharge status: Home / Self Care | Payer: BC Managed Care – PPO | Attending: Obstetrics and Gynecology | Admitting: Obstetrics and Gynecology

## 2020-12-20 DIAGNOSIS — R9389 Abnormal findings on diagnostic imaging of other specified body structures: Secondary | ICD-10-CM

## 2020-12-20 DIAGNOSIS — N939 Abnormal uterine and vaginal bleeding, unspecified: Secondary | ICD-10-CM | POA: Insufficient documentation

## 2020-12-20 DIAGNOSIS — Z20822 Contact with and (suspected) exposure to covid-19: Secondary | ICD-10-CM | POA: Diagnosis not present

## 2020-12-20 DIAGNOSIS — N840 Polyp of corpus uteri: Secondary | ICD-10-CM | POA: Diagnosis not present

## 2020-12-20 DIAGNOSIS — D649 Anemia, unspecified: Secondary | ICD-10-CM | POA: Diagnosis not present

## 2020-12-20 HISTORY — PX: HYSTEROSCOPY WITH D & C: SHX1775

## 2020-12-20 HISTORY — DX: Anemia, unspecified: D64.9

## 2020-12-20 SURGERY — DILATATION AND CURETTAGE /HYSTEROSCOPY
Anesthesia: General | Site: Uterus

## 2020-12-20 MED ORDER — LIDOCAINE HCL (CARDIAC) PF 100 MG/5ML IV SOSY
PREFILLED_SYRINGE | INTRAVENOUS | Status: DC | PRN
  Administered 2020-12-20: 60 mg via INTRAVENOUS

## 2020-12-20 MED ORDER — PROMETHAZINE HCL 25 MG/ML IJ SOLN: 6.2500 mg | INTRAMUSCULAR | Status: DC | PRN

## 2020-12-20 MED ORDER — PROPOFOL 10 MG/ML IV BOLUS
INTRAVENOUS | Status: DC | PRN
  Administered 2020-12-20: 100 mg via INTRAVENOUS

## 2020-12-20 MED ORDER — ACETAMINOPHEN 500 MG PO TABS
ORAL_TABLET | ORAL | Status: AC
  Filled 2020-12-20: qty 2

## 2020-12-20 MED ORDER — FENTANYL CITRATE (PF) 100 MCG/2ML IJ SOLN
INTRAMUSCULAR | Status: AC
  Filled 2020-12-20: qty 2

## 2020-12-20 MED ORDER — LACTATED RINGERS IV SOLN: INTRAVENOUS | Status: DC

## 2020-12-20 MED ORDER — KETOROLAC TROMETHAMINE 15 MG/ML IJ SOLN: 15.0000 mg | INTRAMUSCULAR | Status: DC

## 2020-12-20 MED ORDER — FENTANYL CITRATE (PF) 100 MCG/2ML IJ SOLN
INTRAMUSCULAR | Status: DC | PRN
  Administered 2020-12-20 (×3): 50 ug via INTRAVENOUS

## 2020-12-20 MED ORDER — SODIUM CHLORIDE 0.9 % IR SOLN
Status: DC | PRN
  Administered 2020-12-20: 3000 mL

## 2020-12-20 MED ORDER — SOD CITRATE-CITRIC ACID 500-334 MG/5ML PO SOLN
ORAL | Status: AC
  Filled 2020-12-20: qty 15

## 2020-12-20 MED ORDER — POVIDONE-IODINE 10 % EX SWAB: 2.0000 "application " | Freq: Once | CUTANEOUS | Status: DC

## 2020-12-20 MED ORDER — IBUPROFEN 800 MG PO TABS: 800.0000 mg | ORAL_TABLET | Freq: Three times a day (TID) | ORAL | 0 refills | Status: DC | PRN

## 2020-12-20 MED ORDER — FENTANYL CITRATE (PF) 100 MCG/2ML IJ SOLN: 25.0000 ug | INTRAMUSCULAR | Status: DC | PRN

## 2020-12-20 MED ORDER — OXYCODONE HCL 5 MG PO TABS: 5.0000 mg | ORAL_TABLET | Freq: Four times a day (QID) | ORAL | 0 refills | Status: DC | PRN

## 2020-12-20 MED ORDER — BUPIVACAINE HCL (PF) 0.25 % IJ SOLN
INTRAMUSCULAR | Status: DC | PRN
  Administered 2020-12-20: 10 mL

## 2020-12-20 MED ORDER — KETOROLAC TROMETHAMINE 30 MG/ML IJ SOLN
INTRAMUSCULAR | Status: DC | PRN
  Administered 2020-12-20: 30 mg via INTRAVENOUS

## 2020-12-20 MED ORDER — MIDAZOLAM HCL 5 MG/5ML IJ SOLN
INTRAMUSCULAR | Status: DC | PRN
  Administered 2020-12-20: 2 mg via INTRAVENOUS

## 2020-12-20 MED ORDER — PROPOFOL 10 MG/ML IV BOLUS
INTRAVENOUS | Status: AC
  Filled 2020-12-20: qty 20

## 2020-12-20 MED ORDER — SOD CITRATE-CITRIC ACID 500-334 MG/5ML PO SOLN
30.0000 mL | ORAL | Status: AC
  Administered 2020-12-20: 30 mL via ORAL

## 2020-12-20 MED ORDER — DEXAMETHASONE SODIUM PHOSPHATE 4 MG/ML IJ SOLN
INTRAMUSCULAR | Status: DC | PRN
  Administered 2020-12-20: 10 mg via INTRAVENOUS

## 2020-12-20 MED ORDER — ACETAMINOPHEN 500 MG PO TABS
1000.0000 mg | ORAL_TABLET | ORAL | Status: AC
  Administered 2020-12-20: 1000 mg via ORAL

## 2020-12-20 MED ORDER — OXYCODONE HCL 5 MG PO TABS: 5.0000 mg | ORAL_TABLET | Freq: Once | ORAL | Status: DC | PRN

## 2020-12-20 MED ORDER — KETOROLAC TROMETHAMINE 15 MG/ML IJ SOLN
INTRAMUSCULAR | Status: AC
  Filled 2020-12-20: qty 1

## 2020-12-20 MED ORDER — OXYCODONE HCL 5 MG/5ML PO SOLN: 5.0000 mg | Freq: Once | ORAL | Status: DC | PRN

## 2020-12-20 MED ORDER — MIDAZOLAM HCL 2 MG/2ML IJ SOLN
INTRAMUSCULAR | Status: AC
  Filled 2020-12-20: qty 2

## 2020-12-20 SURGICAL SUPPLY — 15 items
CATH ROBINSON RED A/P 16FR (CATHETERS) IMPLANT
DILATOR CANAL MILEX (MISCELLANEOUS) IMPLANT
GAUZE 4X4 16PLY RFD (DISPOSABLE) ×2 IMPLANT
GLOVE SURG ENC MOIS LTX SZ7.5 (GLOVE) ×2 IMPLANT
GLOVE SURG UNDER POLY LF SZ7 (GLOVE) ×2 IMPLANT
GOWN STRL REUS W/TWL LRG LVL3 (GOWN DISPOSABLE) ×2 IMPLANT
GOWN STRL REUS W/TWL XL LVL3 (GOWN DISPOSABLE) ×2 IMPLANT
KIT PROCEDURE FLUENT (KITS) ×2 IMPLANT
PACK VAGINAL MINOR WOMEN LF (CUSTOM PROCEDURE TRAY) ×2 IMPLANT
PAD OB MATERNITY 4.3X12.25 (PERSONAL CARE ITEMS) ×2 IMPLANT
PAD PREP 24X48 CUFFED NSTRL (MISCELLANEOUS) ×2 IMPLANT
SCOPETTES 8  STERILE (MISCELLANEOUS)
SCOPETTES 8 STERILE (MISCELLANEOUS) IMPLANT
SLEEVE SCD COMPRESS KNEE MED (STOCKING) ×2 IMPLANT
TOWEL GREEN STERILE FF (TOWEL DISPOSABLE) ×2 IMPLANT

## 2020-12-20 NOTE — Anesthesia Preprocedure Evaluation (Addendum)
Anesthesia Evaluation  Patient identified by MRN, date of birth, ID band Patient awake    Reviewed: Allergy & Precautions, NPO status , Patient's Chart, lab work & pertinent test results  History of Anesthesia Complications Negative for: history of anesthetic complications  Airway Mallampati: I  TM Distance: >3 FB Neck ROM: Full    Dental no notable dental hx.    Pulmonary neg pulmonary ROS,    Pulmonary exam normal        Cardiovascular negative cardio ROS Normal cardiovascular exam     Neuro/Psych negative neurological ROS  negative psych ROS   GI/Hepatic negative GI ROS, Neg liver ROS,   Endo/Other  negative endocrine ROS  Renal/GU negative Renal ROS  negative genitourinary   Musculoskeletal negative musculoskeletal ROS (+)   Abdominal   Peds  Hematology  (+) anemia , Hgb 9.3   Anesthesia Other Findings Day of surgery medications reviewed with patient.  Reproductive/Obstetrics Abnormal uterine bleeding                             Anesthesia Physical Anesthesia Plan  ASA: II  Anesthesia Plan: General   Post-op Pain Management:    Induction: Intravenous  PONV Risk Score and Plan: 3 and Treatment may vary due to age or medical condition, Ondansetron, Dexamethasone and Midazolam  Airway Management Planned: LMA  Additional Equipment: None  Intra-op Plan:   Post-operative Plan: Extubation in OR  Informed Consent: I have reviewed the patients History and Physical, chart, labs and discussed the procedure including the risks, benefits and alternatives for the proposed anesthesia with the patient or authorized representative who has indicated his/her understanding and acceptance.     Dental advisory given  Plan Discussed with: CRNA  Anesthesia Plan Comments:        Anesthesia Quick Evaluation

## 2020-12-20 NOTE — Discharge Instructions (Signed)
Hysteroscopy Hysteroscopy is a procedure used to look inside a woman's womb (uterus). This may be done for various reasons, including:  To look for tumors and other growths in the uterus.  To evaluate abnormal bleeding, fibroid tumors, polyps, scar tissue, or uterine cancer.  To determine why a woman is unable to get pregnant or has had repeated pregnancy losses.  To locate an IUD (intrauterine device).  To place a birth control device into the fallopian tubes. During this procedure, a thin, flexible tube with a small light and camera (hysteroscope) is used to examine the uterus. The camera sends images to a monitor in the room so that your health care provider can view the inside of your uterus. A hysteroscopy should be done right after a menstrual period. Tell a health care provider about:  Any allergies you have.  All medicines you are taking, including vitamins, herbs, eye drops, creams, and over-the-counter medicines.  Any problems you or family members have had with anesthetic medicines.  Any blood disorders you have.  Any surgeries you have had.  Any medical conditions you have.  Whether you are pregnant or may be pregnant.  Whether you have been diagnosed with an STI (sexually transmitted infection) or you think you have an STI. What are the risks? Generally, this is a safe procedure. However, problems may occur, including:  Excessive bleeding.  Infection.  Damage to the uterus or other structures or organs.  Allergic reaction to medicines or fluids that are used in the procedure. What happens before the procedure? Staying hydrated Follow instructions from your health care provider about hydration, which may include:  Up to 2 hours before the procedure - you may continue to drink clear liquids, such as water, clear fruit juice, black coffee, and plain tea. Eating and drinking restrictions Follow instructions from your health care provider about eating and  drinking, which may include:  8 hours before the procedure - stop eating solid foods and drink clear liquids only.  2 hours before the procedure - stop drinking clear liquids. Medicines  Ask your health care provider about: ? Changing or stopping your regular medicines. This is especially important if you are taking diabetes medicines or blood thinners. ? Taking medicines such as aspirin and ibuprofen. These medicines can thin your blood. Do not take these medicines unless your health care provider tells you to take them. ? Taking over-the-counter medicines, vitamins, herbs, and supplements.  Medicine may be placed in your cervix the day before the procedure. This medicine causes the cervix to open (dilate). The larger opening makes it easier for the hysteroscope to be inserted into the uterus during the procedure. General instructions  Ask your health care provider: ? What steps will be taken to help prevent infection. These steps may include:  Washing skin with a germ-killing soap.  Taking antibiotic medicine.  Do not use any products that contain nicotine or tobacco for at least 4 weeks before the procedure. These products include cigarettes, chewing tobacco, and vaping devices, such as e-cigarettes. If you need help quitting, ask your health care provider.  Plan to have a responsible adult take you home from the hospital or clinic.  Plan to have a responsible adult care for you for the time you are told after you leave the hospital or clinic. This is important.  Empty your bladder before the procedure begins. What happens during the procedure?  An IV will be inserted into one of your veins.  You may be given: ?  A medicine to help you relax (sedative). ? A medicine that numbs the area around the cervix (local anesthetic). ? A medicine to make you fall asleep (general anesthetic).  A hysteroscope will be inserted through your vagina and into your uterus.  Air or fluid will  be used to enlarge your uterus to allow your health care provider to see it better. The amount of fluid used will be carefully checked throughout the procedure.  In some cases, tissue may be gently scraped from inside the uterus and sent to a lab for testing (biopsy). The procedure may vary among health care providers and hospitals. What can I expect after the procedure?  Your blood pressure, heart rate, breathing rate, and blood oxygen level will be monitored until you leave the hospital or clinic.  You may have cramps. You may be given medicines for this.  You may have bleeding, which may vary from light spotting to menstrual-like bleeding. This is normal.  If you had a biopsy, it is up to you to get the results. Ask your health care provider, or the department that is doing the procedure, when your results will be ready. Follow these instructions at home: Activity  Rest as told by your health care provider.  Return to your normal activities as told by your health care provider. Ask your health care provider what activities are safe for you.  If you were given a sedative during the procedure, it can affect you for several hours. Do not drive or operate machinery until your health care provider says that it is safe. Medicines  Do not take aspirin or other NSAIDs during recovery, as told by your healthcare provider. It can increase the risk of bleeding.  Ask your health care provider if the medicine prescribed to you: ? Requires you to avoid driving or using machinery. ? Can cause constipation. You may need to take these actions to prevent or treat constipation:  Drink enough fluid to keep your urine pale yellow.  Take over-the-counter or prescription medicines.  Eat foods that are high in fiber, such as beans, whole grains, and fresh fruits and vegetables.  Limit foods that are high in fat and processed sugars, such as fried or sweet foods. General instructions  Do not douche,  use tampons, or have sex for 2 weeks after the procedure, or until your health care provider approves.  Do not take baths, swim, or use a hot tub until your health care provider approves. Take showers instead of baths for 2 weeks, or for as long as told by your health care provider.  Keep all follow-up visits. This is important. Contact a health care provider if:  You feel dizzy or lightheaded.  You feel nauseous.  You have abnormal vaginal discharge.  You have a rash.  You have pain that does not get better with medicine.  You have chills. Get help right away if:  You have bleeding that is heavier than a normal menstrual period.  You have a fever.  You have pain or cramps that get worse.  You develop new abdominal pain.  You faint.  You have pain in your shoulder.  You are short of breath. Summary  Hysteroscopy is a procedure that is used to look inside a woman's womb (uterus).  After the procedure, you may have bleeding, which varies from light spotting to menstrual-like bleeding. This is normal. You may also have cramps.  Do not douche, use tampons, or have sex for 2 weeks after  the procedure, or until your health care provider approves.  Plan to have a responsible adult take you home from the hospital or clinic. This information is not intended to replace advice given to you by your health care provider. Make sure you discuss any questions you have with your health care provider. Document Revised: 03/22/2020 Document Reviewed: 03/22/2020 Elsevier Patient Education  2021 [place].  May take Tylenol after 6:15pm, if needed.  May take Ibuprofen after 7:20pm, if needed.   Post Anesthesia Home Care Instructions  Activity: Get plenty of rest for the remainder of the day. A responsible individual must stay with you for 24 hours following the procedure.  For the next 24 hours, DO NOT: -Drive a car -Paediatric nurse -Drink alcoholic beverages -Take any  medication unless instructed by your physician -Make any legal decisions or sign important papers.  Meals: Start with liquid foods such as gelatin or soup. Progress to regular foods as tolerated. Avoid greasy, spicy, heavy foods. If nausea and/or vomiting occur, drink only clear liquids until the nausea and/or vomiting subsides. Call your physician if vomiting continues.  Special Instructions/Symptoms: Your throat may feel dry or sore from the anesthesia or the breathing tube placed in your throat during surgery. If this causes discomfort, gargle with warm salt water. The discomfort should disappear within 24 hours.  If you had a scopolamine patch placed behind your ear for the management of post- operative nausea and/or vomiting:  1. The medication in the patch is effective for 72 hours, after which it should be removed.  Wrap patch in a tissue and discard in the trash. Wash hands thoroughly with soap and water. 2. You may remove the patch earlier than 72 hours if you experience unpleasant side effects which may include dry mouth, dizziness or visual disturbances. 3. Avoid touching the patch. Wash your hands with soap and water after contact with the patch.

## 2020-12-20 NOTE — Anesthesia Procedure Notes (Signed)
Procedure Name: LMA Insertion Date/Time: 12/20/2020 12:56 PM Performed by: Willa Frater, CRNA Pre-anesthesia Checklist: Patient identified, Emergency Drugs available, Suction available and Patient being monitored Patient Re-evaluated:Patient Re-evaluated prior to induction Oxygen Delivery Method: Circle system utilized Preoxygenation: Pre-oxygenation with 100% oxygen Induction Type: IV induction Ventilation: Mask ventilation without difficulty LMA: LMA inserted LMA Size: 4.0 Number of attempts: 1 Airway Equipment and Method: Bite block Placement Confirmation: positive ETCO2 Tube secured with: Tape Dental Injury: Teeth and Oropharynx as per pre-operative assessment

## 2020-12-20 NOTE — Op Note (Signed)
PREOPERATIVE DIAGNOSIS:Thicken endometrium on U/S POSTOPERATIVE DIAGNOSIS: The same PROCEDURE: Hysteroscopy, Dilation and Curettage. SURGEON:  Dr. Arlina Robes   INDICATIONS: 25 y.o. G0P0000  here for scheduled surgery for the aforementioned diagnoses.   Risks of surgery were discussed with the patient including but not limited to: bleeding which may require transfusion; infection which may require antibiotics; injury to uterus or surrounding organs; intrauterine scarring which may impair future fertility; need for additional procedures including laparotomy or laparoscopy; and other postoperative/anesthesia complications. Written informed consent was obtained.    FINDINGS:  A 8 week size uterus.  Diffuse proliferative endometrium and evidence of endometrial polyp. Normal ostia bilaterally.  ANESTHESIA:   General, paracervical block with 10 ml of 0.25% Marcaine INTRAVENOUS FLUIDS:  As recorded FLUID DEFICITS:  225 ml NS ESTIMATED BLOOD LOSS:  Less than 20 ml SPECIMENS: Endometrial curettings sent to pathology COMPLICATIONS:  None immediate.  PROCEDURE DETAILS:  The patient received intravenous antibiotics while in the preoperative area.  She was then taken to the operating room where general anesthesia was administered and was found to be adequate.  After an adequate timeout was performed, she was placed in the dorsal lithotomy position and examined; then prepped and draped in the sterile manner.   Her bladder was catheterized for an unmeasured amount of clear, yellow urine. A speculum was then placed in the patient's vagina and a single tooth tenaculum was applied to the anterior lip of the cervix.   A paracervical block using 10 ml of 0.25% Marcaine was administered.  The cervix was sounded to 8 cm and dilated manually with metal dilators to accommodate the 5 mm diagnostic hysteroscope.  Once the cervix was dilated, the hysteroscope was inserted under direct visualization using NS as a suspension  medium.  The uterine cavity was carefully examined with the findings as noted above.   After further careful visualization of the uterine cavity, the hysteroscope was removed under direct visualization.  A sharp curettage was then performed to obtain a moderate amount of endometrial curettings.  The tenaculum was removed from the anterior lip of the cervix and the vaginal speculum was removed after noting good hemostasis.  The patient tolerated the procedure well and was taken to the recovery area awake, extubated and in stable condition.  The patient will be discharged to home as per PACU criteria.  Routine postoperative instructions given.  She was prescribed Percocet and Ibuprofen.  She will follow up in the clinic  for postoperative evaluation.

## 2020-12-20 NOTE — Interval H&P Note (Signed)
History and Physical Interval Note:  0/12/3974 73:41 PM  Gina Burke  has presented today for surgery, with the diagnosis of AUB.  The various methods of treatment have been discussed with the patient and family. After consideration of risks, benefits and other options for treatment, the patient has consented to  Procedure(s) with comments: DILATATION AND CURETTAGE /HYSTEROSCOPY possible polyp resection (N/A) - Possible polyp resection. as a surgical intervention.  The patient's history has been reviewed, patient examined, no change in status, stable for surgery.  I have reviewed the patient's chart and labs.  Questions were answered to the patient's satisfaction.     Chancy Milroy

## 2020-12-20 NOTE — Transfer of Care (Signed)
Immediate Anesthesia Transfer of Care Note  Patient: Gina Burke  Procedure(s) Performed: DILATATION AND CURETTAGE /HYSTEROSCOPY with polyp removal (N/A Uterus)  Patient Location: PACU  Anesthesia Type:General  Level of Consciousness: awake, alert , oriented, drowsy and patient cooperative  Airway & Oxygen Therapy: Patient Spontanous Breathing and Patient connected to face mask oxygen  Post-op Assessment: Report given to RN and Post -op Vital signs reviewed and stable  Post vital signs: Reviewed and stable  Last Vitals:  Vitals Value Taken Time  BP    Temp    Pulse 35 12/20/20 1341  Resp 21 12/20/20 1341  SpO2 79 % 12/20/20 1341  Vitals shown include unvalidated device data.  Last Pain:  Vitals:   12/20/20 1213  TempSrc: Oral  PainSc: 0-No pain         Complications: No complications documented.

## 2020-12-20 NOTE — Anesthesia Postprocedure Evaluation (Signed)
Anesthesia Post Note  Patient: Gina Burke  Procedure(s) Performed: DILATATION AND CURETTAGE /HYSTEROSCOPY with polyp removal (N/A Uterus)     Patient location during evaluation: PACU Anesthesia Type: General Level of consciousness: awake and alert and oriented Pain management: pain level controlled Vital Signs Assessment: post-procedure vital signs reviewed and stable Respiratory status: spontaneous breathing, nonlabored ventilation and respiratory function stable Cardiovascular status: blood pressure returned to baseline Postop Assessment: no apparent nausea or vomiting Anesthetic complications: no   No complications documented.  Last Vitals:  Vitals:   12/20/20 1400 12/20/20 1424  BP: 113/66 119/83  Pulse: 96 95  Resp: 15 16  Temp:  36.5 C  SpO2: 100% 100%    Last Pain:  Vitals:   12/20/20 1400  TempSrc:   PainSc: 0-No pain                 Brennan Bailey

## 2020-12-21 ENCOUNTER — Encounter (HOSPITAL_BASED_OUTPATIENT_CLINIC_OR_DEPARTMENT_OTHER): Payer: Self-pay | Admitting: Obstetrics and Gynecology

## 2020-12-22 LAB — SURGICAL PATHOLOGY

## 2020-12-26 ENCOUNTER — Telehealth: Payer: Self-pay | Admitting: Obstetrics & Gynecology

## 2020-12-26 NOTE — Telephone Encounter (Signed)
Pt called in regarding post-op management- underwent hysteroscopy D&C on 5/4.  Today she notes that her bleeding has not stopped, she is still using about 4 pads per day and was told that within a few days it would stop.  Of note, surgery was completed due to AUB/HMB.  She does have Megace, but was told to stop it after surgery.  Pt advised to give the bleeding another 3-4 days- if bleeding still continues would then restart Megace as well as call back in to the office.  Pt also notes concerns regarding her abdominal pain.  States the medicine does help, but she only takes the pain medicine when she needs it and then it seemed like the pain will be worse.  Pt advised to take the pain medication more regularly/scheduled for the next 24-48hrs and alternate between ibuprofen and tylenol.  Ok to use heating pack.  If she notes any worsening of symptoms advised to call back in.  Janyth Pupa, DO Attending [place], [facility] for [employer], [place]

## 2020-12-28 ENCOUNTER — Telehealth: Payer: Self-pay

## 2020-12-28 NOTE — Telephone Encounter (Signed)
I called patient to follow up on pain and symptoms. Phone went to voice mail.

## 2020-12-28 NOTE — Telephone Encounter (Signed)
-----   Message from Janyth Pupa, DO sent at 12/26/2020  5:59 PM EDT ----- Regarding: follow up Hi all,  This patient had surgery on 5/4 and does not have a post-op appt for 3 weeks.  She had called in with concerns about continued bleeding/pain.  It did not sound like she need to be seen immediately and I advised her to restart her Megace if needed.  If someone could please call here later this week to check up on her it would be greatly appreciated.  Additionally, it may be beneficial to move up her postop appointment if possible.  Thanks! -Dr. Nelda Marseille

## 2021-01-04 ENCOUNTER — Telehealth: Payer: Self-pay | Admitting: *Deleted

## 2021-01-04 NOTE — Telephone Encounter (Signed)
Spoke with patient. She states that her bleeding and pain have improved. Plans to come for post op appointment next week.

## 2021-01-10 ENCOUNTER — Other Ambulatory Visit: Payer: Self-pay

## 2021-01-10 ENCOUNTER — Ambulatory Visit (INDEPENDENT_AMBULATORY_CARE_PROVIDER_SITE_OTHER): Payer: BC Managed Care – PPO | Admitting: Obstetrics and Gynecology

## 2021-01-10 ENCOUNTER — Encounter: Payer: Self-pay | Admitting: Obstetrics and Gynecology

## 2021-01-10 VITALS — BP 103/71 | HR 87 | Wt 121.0 lb

## 2021-01-10 DIAGNOSIS — N946 Dysmenorrhea, unspecified: Secondary | ICD-10-CM

## 2021-01-10 DIAGNOSIS — R9389 Abnormal findings on diagnostic imaging of other specified body structures: Secondary | ICD-10-CM

## 2021-01-10 MED ORDER — DESOGESTREL-ETHINYL ESTRADIOL 0.15-30 MG-MCG PO TABS: 1.0000 | ORAL_TABLET | Freq: Every day | ORAL | 11 refills | Status: DC

## 2021-01-10 NOTE — Progress Notes (Signed)
Pt states she has been bleeding since procedure, pt started Megace again x 8 days and then began bleeding again. Pt states bleeding is beginning to be heavy again with clots and painful.

## 2021-01-10 NOTE — Progress Notes (Signed)
Patient ID: Gina Burke, female   DOB: 11-Feb-1996, 25 y.o.   MRN: [id number] Ms Mullan presents for post op appt. Had D & C for endometrial polyp. Pathology reviewed with pt. Still bleeding with cramps  PE AF VSS Lungs clear Heart RRR Abd soft + BS  A/P Post op appt        Heavy cycles        Dysmenorrhea  Return to normal ADL's. Will start Apri this Sunday. U/R/B reviewed with pt. F/U in 3 months

## 2021-01-12 ENCOUNTER — Encounter (HOSPITAL_COMMUNITY): Payer: Self-pay | Admitting: *Deleted

## 2021-01-12 ENCOUNTER — Emergency Department (HOSPITAL_COMMUNITY)
Admission: EM | Admit: 2021-01-12 | Discharge: 2021-01-13 | Disposition: A | Discharge status: Home / Self Care | Payer: BC Managed Care – PPO | Attending: Emergency Medicine | Admitting: Emergency Medicine

## 2021-01-12 ENCOUNTER — Other Ambulatory Visit: Payer: Self-pay

## 2021-01-12 ENCOUNTER — Emergency Department (HOSPITAL_COMMUNITY): Payer: BC Managed Care – PPO

## 2021-01-12 DIAGNOSIS — N938 Other specified abnormal uterine and vaginal bleeding: Secondary | ICD-10-CM | POA: Insufficient documentation

## 2021-01-12 DIAGNOSIS — N719 Inflammatory disease of uterus, unspecified: Secondary | ICD-10-CM | POA: Diagnosis not present

## 2021-01-12 DIAGNOSIS — R42 Dizziness and giddiness: Secondary | ICD-10-CM | POA: Diagnosis not present

## 2021-01-12 DIAGNOSIS — R1030 Lower abdominal pain, unspecified: Secondary | ICD-10-CM | POA: Diagnosis not present

## 2021-01-12 DIAGNOSIS — N8300 Follicular cyst of ovary, unspecified side: Secondary | ICD-10-CM | POA: Diagnosis not present

## 2021-01-12 DIAGNOSIS — R109 Unspecified abdominal pain: Secondary | ICD-10-CM | POA: Diagnosis not present

## 2021-01-12 DIAGNOSIS — N939 Abnormal uterine and vaginal bleeding, unspecified: Secondary | ICD-10-CM | POA: Diagnosis not present

## 2021-01-12 LAB — CBC WITH DIFFERENTIAL/PLATELET
Abs Immature Granulocytes: 0.01 10*3/uL (ref 0.00–0.07)
Basophils Absolute: 0 10*3/uL (ref 0.0–0.1)
Basophils Relative: 0 %
Eosinophils Absolute: 0.1 10*3/uL (ref 0.0–0.5)
Eosinophils Relative: 3 %
HCT: 31.2 % — ABNORMAL LOW (ref 36.0–46.0)
Hemoglobin: 9.4 g/dL — ABNORMAL LOW (ref 12.0–15.0)
Immature Granulocytes: 0 %
Lymphocytes Relative: 50 %
Lymphs Abs: 2.1 10*3/uL (ref 0.7–4.0)
MCH: 25.2 pg — ABNORMAL LOW (ref 26.0–34.0)
MCHC: 30.1 g/dL (ref 30.0–36.0)
MCV: 83.6 fL (ref 80.0–100.0)
Monocytes Absolute: 0.4 10*3/uL (ref 0.1–1.0)
Monocytes Relative: 9 %
Neutro Abs: 1.6 10*3/uL — ABNORMAL LOW (ref 1.7–7.7)
Neutrophils Relative %: 38 %
Platelets: 240 10*3/uL (ref 150–400)
RBC: 3.73 MIL/uL — ABNORMAL LOW (ref 3.87–5.11)
RDW: 16.9 % — ABNORMAL HIGH (ref 11.5–15.5)
WBC: 4.2 10*3/uL (ref 4.0–10.5)
nRBC: 0 % (ref 0.0–0.2)

## 2021-01-12 LAB — COMPREHENSIVE METABOLIC PANEL
ALT: 11 U/L (ref 0–44)
AST: 20 U/L (ref 15–41)
Albumin: 4 g/dL (ref 3.5–5.0)
Alkaline Phosphatase: 45 U/L (ref 38–126)
Anion gap: 5 (ref 5–15)
BUN: <5 mg/dL — ABNORMAL LOW (ref 6–20)
CO2: 22 mmol/L (ref 22–32)
Calcium: 9 mg/dL (ref 8.9–10.3)
Chloride: 111 mmol/L (ref 98–111)
Creatinine, Ser: 0.58 mg/dL (ref 0.44–1.00)
GFR, Estimated: >60 mL/min (ref >60)
Glucose, Bld: 87 mg/dL (ref 70–99)
Potassium: 3.7 mmol/L (ref 3.5–5.1)
Sodium: 138 mmol/L (ref 135–145)
Total Bilirubin: 0.3 mg/dL (ref 0.3–1.2)
Total Protein: 6.9 g/dL (ref 6.5–8.1)

## 2021-01-12 LAB — I-STAT BETA HCG BLOOD, ED (MC, WL, AP ONLY): I-stat hCG, quantitative: <5 m[IU]/mL (ref <5)

## 2021-01-12 LAB — LIPASE, BLOOD: Lipase: 32 U/L (ref 11–51)

## 2021-01-12 NOTE — ED Triage Notes (Signed)
The pt is c/o abd painand vaginal bleeding since having a biopsy ??  On the 4th of may  She saw her doctor yesterday and she reports that she was told to come to the ed if it gets worse.. haevy bleeding for 3 days  lmp feb 28th

## 2021-01-12 NOTE — ED Provider Notes (Signed)
Emergency Medicine Provider Triage Evaluation Note  Gina Burke , a 25 y.o. female  was evaluated in triage.  Pt complains of abdominal pain and vaginal bleeding.  Had D&C with Dr. Rip Harbour on 12/20/2020 abnormal uterine bleeding and polyp.  Has continued to have lower abdominal pain since.  She is now passing large clots.  She has not been sexually active.  No vaginal bleeding.  Occasionally feels lightheaded.  Spoke with Gyn who recommended come here to the emergency department to be evaluated.  Has attempted Megace without relief  Review of Systems  Positive: Abdominal pain, vaginal bleeding Negative: Syncope, chest pain, shortness of breath  Physical Exam  BP 138/84 (BP Location: Right Arm)   Pulse (!) 104   Temp 97.7 F (36.5 C) (Oral)   Resp 16   SpO2 100%  Gen:   Awake, no distress  Resp:  Normal effort  MSK:   Moves extremities without difficulty  Other:    Medical Decision Making  Medically screening exam initiated at 4:27 PM.  Appropriate orders placed.  Gina Burke was informed that the remainder of the evaluation will be completed by another provider, this initial triage assessment does not replace that evaluation, and the importance of remaining in the ED until their evaluation is complete.  Lower abdominal pain, vaginal bleeding   Gina Burke A, PA-C 01/12/21 1629    Breck Coons, MD 01/12/21 [phone]

## 2021-01-13 MED ORDER — KETOROLAC TROMETHAMINE 30 MG/ML IJ SOLN: 30.0000 mg | Freq: Once | INTRAMUSCULAR | Status: DC

## 2021-01-13 MED ORDER — DOXYCYCLINE HYCLATE 100 MG PO CAPS: 100.0000 mg | ORAL_CAPSULE | Freq: Two times a day (BID) | ORAL | 0 refills | Status: DC

## 2021-01-13 MED ORDER — KETOROLAC TROMETHAMINE 30 MG/ML IJ SOLN
30.0000 mg | Freq: Once | INTRAMUSCULAR | Status: AC
  Administered 2021-01-13: 30 mg via INTRAMUSCULAR
  Filled 2021-01-13: qty 1

## 2021-01-13 NOTE — Discharge Instructions (Addendum)
You were seen today for ongoing bleeding and abdominal pain.  Start your birth control on Sunday as planned.  Continue pain medications at home.  You will be started on an antibiotic to cover for possible endometritis.  Follow-up with your OB/GYN on Monday.

## 2021-01-13 NOTE — ED Provider Notes (Signed)
[facility] EMERGENCY DEPARTMENT Provider Note   CSN: [id number] Arrival date & time: 01/12/21  1619     History Chief Complaint  Patient presents with  . Abdominal Pain    Gina Burke is a 25 y.o. female.  HPI     This is a 25 year old female with a history of abnormal uterine bleeding, endometrial polyp with recent D&C and hysteroscopy on May 4th who presents with abdominal pain and vaginal bleeding.  Patient reports that since her procedure she has had some ongoing vaginal bleeding.  However, over the last several days she has had increased bleeding and cramping in the lower abdomen.  She reports passage of large clots.  She states she is having to change her adult diaper hourly.  She has had some dizziness.  No fevers.  She does report some chills.  She saw her OB/GYN on Wednesday and was told to come the emergency department if she has ongoing symptoms.  She rates her pain at 7 out of 10.  She states she is taking oxycodone and ibuprofen at home with minimal relief.  Past Medical History:  Diagnosis Date  . Abnormal uterine bleeding (AUB) 10/16/2020  . Anemia   . Medical history non-contributory     Patient Active Problem List   Diagnosis Date Noted  . Thickened endometrium   . Refugee health examination 02/09/2013  . Dysmenorrhea 02/09/2013  . Pediatric body mass index (BMI) of 5th percentile to less than 85th percentile for age 61/24/2014    Past Surgical History:  Procedure Laterality Date  . HYSTEROSCOPY WITH D & C N/A 12/20/2020   Procedure: DILATATION AND CURETTAGE /HYSTEROSCOPY with polyp removal;  Surgeon: Chancy Milroy, MD;  Location: [facility];  Service: Gynecology;  Laterality: N/A;  . NO PAST SURGERIES       OB History    Gravida  0   Para  0   Term  0   Preterm  0   AB  0   Living  0     SAB  0   IAB  0   Ectopic  0   Multiple  0   Live Births  0           Family History  Problem Relation  Age of Onset  . Heart disease Brother     Social History   Tobacco Use  . Smoking status: Never Smoker  . Smokeless tobacco: Never Used  Vaping Use  . Vaping Use: Never used  Substance Use Topics  . Alcohol use: Yes    Comment: occas  . Drug use: No    Home Medications Prior to Admission medications   Medication Sig Start Date End Date Taking? Authorizing Provider  desogestrel-ethinyl estradiol (APRI) 0.15-30 MG-MCG tablet Take 1 tablet by mouth daily. 01/10/21 01/10/22 Yes Chancy Milroy, MD  doxycycline (VIBRAMYCIN) 100 MG capsule Take 1 capsule (100 mg total) by mouth 2 (two) times daily. 01/13/21  Yes Latreece Mochizuki, Barbette Hair, MD  ferrous sulfate (FERROUSUL) 325 (65 FE) MG tablet Take 1 tablet (325 mg total) by mouth 2 (two) times daily. 12/12/20  Yes Constant, Peggy, MD  ibuprofen (ADVIL) 800 MG tablet Take 1 tablet (800 mg total) by mouth every 8 (eight) hours as needed. Patient taking differently: Take 800 mg by mouth every 8 (eight) hours as needed for mild pain. 12/20/20  Yes Chancy Milroy, MD  oxyCODONE (OXY IR/ROXICODONE) 5 MG immediate release tablet Take 1 tablet (  5 mg total) by mouth every 6 (six) hours as needed for severe pain. 12/20/20  Yes Chancy Milroy, MD    Allergies    Patient has no known allergies.  Review of Systems   Review of Systems  Constitutional: Positive for chills. Negative for fever.  Respiratory: Negative for shortness of breath.   Cardiovascular: Negative for chest pain.  Gastrointestinal: Positive for abdominal pain. Negative for nausea and vomiting.  Genitourinary: Positive for vaginal bleeding.  All other systems reviewed and are negative.   Physical Exam Updated Vital Signs BP 108/68   Pulse 80   Temp 98.2 F (36.8 C)   Resp 16   Ht 1.626 m (5\' 4" )   Wt 54.9 kg   LMP 10/15/2020   SpO2 99%   BMI 20.78 kg/m   Physical Exam Vitals and nursing note reviewed.  Constitutional:      Appearance: She is well-developed. She is not  ill-appearing.  HENT:     Head: Normocephalic and atraumatic.  Eyes:     Pupils: Pupils are equal, round, and reactive to light.  Cardiovascular:     Rate and Rhythm: Normal rate and regular rhythm.     Heart sounds: Normal heart sounds.  Pulmonary:     Effort: Pulmonary effort is normal. No respiratory distress.     Breath sounds: No wheezing.  Abdominal:     General: Bowel sounds are normal.     Palpations: Abdomen is soft.     Tenderness: There is abdominal tenderness in the suprapubic area. There is no guarding or rebound.  Genitourinary:    Comments: Moderate vaginal bleeding, no clots noted Musculoskeletal:     Cervical back: Neck supple.  Skin:    General: Skin is warm and dry.  Neurological:     Mental Status: She is alert and oriented to person, place, and time.  Psychiatric:        Mood and Affect: Mood normal.     ED Results / Procedures / Treatments   Labs (all labs ordered are listed, but only abnormal results are displayed) Labs Reviewed  CBC WITH DIFFERENTIAL/PLATELET - Abnormal; Notable for the following components:      Result Value   RBC 3.73 (*)    Hemoglobin 9.4 (*)    HCT 31.2 (*)    MCH 25.2 (*)    RDW 16.9 (*)    Neutro Abs 1.6 (*)    All other components within normal limits  COMPREHENSIVE METABOLIC PANEL - Abnormal; Notable for the following components:   BUN <5 (*)    All other components within normal limits  LIPASE, BLOOD  I-STAT BETA HCG BLOOD, ED ([facility], [facility], [facility] ONLY)    EKG None  Radiology US Pelvis Complete  Result Date: 01/12/2021 CLINICAL DATA:  Pelvic pain and bleeding.  Recent D and C. EXAM: TRANSABDOMINAL ULTRASOUND OF PELVIS TECHNIQUE: Transabdominal ultrasound examination of the pelvis was performed including evaluation of the uterus, ovaries, adnexal regions, and pelvic cul-de-sac. COMPARISON:  Pelvic ultrasound 11/17/2020 FINDINGS: Uterus Measurements: 7.3 x 4.0 x 5.1 cm = volume: 77 mL. Uterus is anteverted. No fibroids or  other mass visualized. Endometrium Thickness: 9.8 mm distally, 12 mm in the midportion. The endometrium is heterogeneous with occasional areas of vascularity. The previous 2.9 cm endometrial lesion is not definitively seen on this transabdominal exam. Right ovary Measurements: 3.0 x 1.5 x 2.3 cm = volume: 5.5 mL. Normal appearance with small follicles. Blood flow is seen. No dominant cyst or  adnexal mass. Left ovary Measurements: 2.5 x 1.6 x 3.3 cm = volume: 6.8 mL. Small follicular cyst measuring 11 mm, no suspicious lesion or adnexal mass. Other findings:  No abnormal free fluid. IMPRESSION: 1. The previous endometrial lesion is no longer seen. There is mild endometrial irregularity measuring 9-12 mm with some vascularity, may represent sequela of recent D and C. The possibility of endometritis is also raised. 2. Normal sonographic appearance of the ovaries. Electronically Signed   By: Keith Rake M.D.   On: 01/12/2021 17:35    Procedures Procedures   Medications Ordered in ED Medications  ketorolac (TORADOL) 30 MG/ML injection 30 mg (30 mg Intramuscular Given 01/13/21 0054)    ED Course  I have reviewed the triage vital signs and the nursing notes.  Pertinent labs & imaging results that were available during my care of the patient were reviewed by me and considered in my medical decision making (see chart for details).  Clinical Course as of 01/13/21 0740  Sat Jan 13, 2021  0222 Spoke with Dr. Kennon Rounds regarding ultrasound findings.  Recommends starting doxycycline empirically for 10 days. [CH]    Clinical Course User Index [CH] Uriyah Massimo, Barbette Hair, MD   MDM Rules/Calculators/A&P                          Patient presents with abdominal pain and vaginal bleeding.  Has been ongoing but worsened since [facility] in early May.  Was seen and evaluated by her OB/GYN but directed to the emergency department for worsening pain.  Ultrasound obtained in triage shows mild endometrial irregularity and some  vascularity which could be related to D&C.  Endometritis is also a consideration.  Patient clinically has no symptoms consistent with endometritis including no leukocytosis or fever.  However, she did recently have this procedure.  I discussed the patient with Dr. Kennon Rounds who recommends empirically starting doxycycline given ongoing pain.  Patient is due to start birth control on Sunday.  At this time her hemoglobin is stable and do not feel she needs any further intervention for her vaginal bleeding.  Could be related to ongoing menorrhagia and dysfunctional uterine bleeding.  Patient much improved after Toradol.  Will discharge with OB/GYN follow-up  After history, exam, and medical workup I feel the patient has been appropriately medically screened and is safe for discharge home. Pertinent diagnoses were discussed with the patient. Patient was given return precautions.  Final Clinical Impression(s) / ED Diagnoses Final diagnoses:  Dysfunctional uterine bleeding  Lower abdominal pain  Endometritis    Rx / DC Orders ED Discharge Orders         Ordered    doxycycline (VIBRAMYCIN) 100 MG capsule  2 times daily        05 /28/22 0223           Merryl Hacker, MD 01/13/21 [phone]

## 2021-03-20 DIAGNOSIS — N921 Excessive and frequent menstruation with irregular cycle: Secondary | ICD-10-CM | POA: Diagnosis not present

## 2021-03-20 DIAGNOSIS — Z1389 Encounter for screening for other disorder: Secondary | ICD-10-CM | POA: Diagnosis not present

## 2021-03-20 DIAGNOSIS — Z130 Encounter for screening for diseases of the blood and blood-forming organs and certain disorders involving the immune mechanism: Secondary | ICD-10-CM | POA: Diagnosis not present

## 2021-03-20 DIAGNOSIS — D500 Iron deficiency anemia secondary to blood loss (chronic): Secondary | ICD-10-CM | POA: Diagnosis not present

## 2021-03-20 DIAGNOSIS — R102 Pelvic and perineal pain: Secondary | ICD-10-CM | POA: Diagnosis not present

## 2021-10-24 NOTE — Progress Notes (Signed)
? ? ? ? ? ? ?  25yo G0 presents with c/o irregular bleeding. LMP 10/16/21. Was diagnosed with a polyp last year and this was removed during a D&C 5/22. Irregular bleeding restarted 10/22 and she began to have pelvic pain with and without her period. She was seen by another provider and was diagnosed with a fibroid, she was suppose to have surgery to remove but her insurance was cancelled. Since then she has been taking OCPs with no improvement. She stopped taking them 1 week ago. She does have a history of anemia. ? ?Review of Systems  ?Constitutional:  Positive for fatigue.  ?Genitourinary:  Positive for menstrual problem, pelvic pain and vaginal bleeding.   ? ?Objective: ? ?Pelvic exam: normal external genitalia, vulva, vagina, cervix, uterus and adnexa, VULVA: normal appearing vulva with no masses, tenderness or lesions, VAGINA: normal appearing vagina with normal color and discharge, no lesions, scant blood present, , CERVIX: normal appearing cervix without discharge or lesions, UTERUS: uterus is normal size, shape, consistency and nontender, enlarged to 6 week's size, irregular, ADNEXA: normal adnexa in size, nontender and no masses. ? ?Testing: ?UPT:  neg ? ?Chaperone offered and declined for exam ? ?Assessment/Plan: ?1. Abnormal vaginal bleeding ?Begin OCPs- rx sent ?- US Transvaginal Non-OB; Future ? ?2. Irregular periods ? ?- Pregnancy, urine ? ?3. Iron deficiency anemia due to chronic blood loss ? ?- CBC ?- Ferritin ?- B12 and Folate Panel ? ?  ?  ?

## 2021-10-25 ENCOUNTER — Ambulatory Visit (INDEPENDENT_AMBULATORY_CARE_PROVIDER_SITE_OTHER): Payer: 59 | Admitting: Radiology

## 2021-10-25 ENCOUNTER — Encounter: Payer: Self-pay | Admitting: Radiology

## 2021-10-25 ENCOUNTER — Other Ambulatory Visit: Payer: Self-pay

## 2021-10-25 VITALS — BP 116/62 | Ht 63.75 in | Wt 120.0 lb

## 2021-10-25 DIAGNOSIS — D500 Iron deficiency anemia secondary to blood loss (chronic): Secondary | ICD-10-CM | POA: Diagnosis not present

## 2021-10-25 DIAGNOSIS — D649 Anemia, unspecified: Secondary | ICD-10-CM | POA: Diagnosis not present

## 2021-10-25 DIAGNOSIS — N926 Irregular menstruation, unspecified: Secondary | ICD-10-CM | POA: Diagnosis not present

## 2021-10-25 DIAGNOSIS — N939 Abnormal uterine and vaginal bleeding, unspecified: Secondary | ICD-10-CM | POA: Diagnosis not present

## 2021-10-25 LAB — PREGNANCY, URINE: Preg Test, Ur: NEGATIVE

## 2021-10-25 MED ORDER — JUNEL FE 24 1-20 MG-MCG(24) PO TABS: 1.0000 | ORAL_TABLET | Freq: Every day | ORAL | 1 refills | Status: DC

## 2021-10-26 ENCOUNTER — Telehealth: Payer: Self-pay | Admitting: *Deleted

## 2021-10-26 DIAGNOSIS — D508 Other iron deficiency anemias: Secondary | ICD-10-CM

## 2021-10-26 LAB — B12 AND FOLATE PANEL
Folate: 16.6 ng/mL
Vitamin B-12: 369 pg/mL (ref 200–1100)

## 2021-10-26 LAB — CBC
HCT: 29.9 % — ABNORMAL LOW (ref 35.0–45.0)
Hemoglobin: 8.4 g/dL — ABNORMAL LOW (ref 11.7–15.5)
MCH: 19.1 pg — ABNORMAL LOW (ref 27.0–33.0)
MCHC: 28.1 g/dL — ABNORMAL LOW (ref 32.0–36.0)
MCV: 68 fL — ABNORMAL LOW (ref 80.0–100.0)
MPV: 11.4 fL (ref 7.5–12.5)
Platelets: 330 10*3/uL (ref 140–400)
RBC: 4.4 10*6/uL (ref 3.80–5.10)
RDW: 15.1 % — ABNORMAL HIGH (ref 11.0–15.0)
WBC: 4.4 10*3/uL (ref 3.8–10.8)

## 2021-10-26 LAB — FERRITIN: Ferritin: 2 ng/mL — ABNORMAL LOW (ref 16–154)

## 2021-10-26 MED ORDER — DROSPIRENONE-ETHINYL ESTRADIOL 3-0.03 MG PO TABS: 1.0000 | ORAL_TABLET | Freq: Every day | ORAL | 1 refills | Status: DC

## 2021-10-26 NOTE — Telephone Encounter (Signed)
Based on her records it was reported she was on Apri. This should be a different pill.  ?

## 2021-10-26 NOTE — Telephone Encounter (Signed)
Patient informed, Rx sent.  

## 2021-10-26 NOTE — Telephone Encounter (Signed)
May try Yasmin for the heavy bleeding

## 2021-10-26 NOTE — Telephone Encounter (Signed)
Patient informed with below, referral placed. Patient aware [facility] will call to schedule. ?

## 2021-10-26 NOTE — Telephone Encounter (Signed)
-----   Message from Kerry Dory, NP sent at 10/26/2021  8:40 AM EST ----- ?Ferritin <2, Hgb 8.4, severely anemic. Please refer to hematology for iron infusion and contact patient to inform. Thanks! ?

## 2021-10-26 NOTE — Telephone Encounter (Signed)
Patient informed, patient said she did take the Apri years ago. She said the Junel FE 1/20 (24) is the most recent pill that was prescribed, the physician that prescribed this pill is not in [facility] system. She knows for sure it was Junel that was most recent pill that caused bleeding.  She said sorry for the confusion, she would like another pill prescribed. Please advise  ?

## 2021-10-26 NOTE — Telephone Encounter (Signed)
Left message for patient to call.

## 2021-10-26 NOTE — Telephone Encounter (Signed)
Patient called stating the birth control Junel FE 1-20 mcg prescribed yesterday at office visit appears to the same pill her other doctor prescribed. Patient said the pill made her bleed.  Patient has not yet picked the medication up from pharmacy yet. Please advise  ?

## 2021-10-29 ENCOUNTER — Telehealth: Payer: Self-pay | Admitting: Nurse Practitioner

## 2021-10-29 ENCOUNTER — Other Ambulatory Visit: Payer: Self-pay | Admitting: *Deleted

## 2021-10-29 ENCOUNTER — Telehealth: Payer: Self-pay | Admitting: *Deleted

## 2021-10-29 DIAGNOSIS — N946 Dysmenorrhea, unspecified: Secondary | ICD-10-CM

## 2021-10-29 NOTE — Telephone Encounter (Signed)
Message received from appointments from Friday. Patient has question about Rx. I left message for patient to call. ?

## 2021-10-29 NOTE — Progress Notes (Signed)
New pt appt for 12/05/2021 and labs 2 days prior. Lab orders entered ?

## 2021-10-29 NOTE — Telephone Encounter (Signed)
Patient called back to schedule initial visit from referral. Patient is aware of dates and times. ?

## 2021-10-29 NOTE — Telephone Encounter (Signed)
Attempted to contact patient to scheduling initial visit, no answer so left voicemail for patient to call back. ?

## 2021-10-30 NOTE — Telephone Encounter (Signed)
Patient scheduled on 12/05/21 at [place] location.  ?

## 2021-11-27 ENCOUNTER — Ambulatory Visit (INDEPENDENT_AMBULATORY_CARE_PROVIDER_SITE_OTHER): Payer: 59 | Admitting: Obstetrics and Gynecology

## 2021-11-27 ENCOUNTER — Ambulatory Visit (INDEPENDENT_AMBULATORY_CARE_PROVIDER_SITE_OTHER): Payer: 59

## 2021-11-27 ENCOUNTER — Encounter: Payer: Self-pay | Admitting: Obstetrics and Gynecology

## 2021-11-27 VITALS — BP 100/70 | HR 80 | Ht 63.75 in | Wt 120.0 lb

## 2021-11-27 DIAGNOSIS — D250 Submucous leiomyoma of uterus: Secondary | ICD-10-CM

## 2021-11-27 DIAGNOSIS — N939 Abnormal uterine and vaginal bleeding, unspecified: Secondary | ICD-10-CM

## 2021-11-27 DIAGNOSIS — D649 Anemia, unspecified: Secondary | ICD-10-CM

## 2021-11-27 LAB — CBC
HCT: 34.7 % — ABNORMAL LOW (ref 35.0–45.0)
Hemoglobin: 9.8 g/dL — ABNORMAL LOW (ref 11.7–15.5)
MCH: 19.4 pg — ABNORMAL LOW (ref 27.0–33.0)
MCHC: 28.2 g/dL — ABNORMAL LOW (ref 32.0–36.0)
MCV: 68.7 fL — ABNORMAL LOW (ref 80.0–100.0)
MPV: 10.2 fL (ref 7.5–12.5)
Platelets: 327 10*3/uL (ref 140–400)
RBC: 5.05 10*6/uL (ref 3.80–5.10)
RDW: 17 % — ABNORMAL HIGH (ref 11.0–15.0)
WBC: 5.6 10*3/uL (ref 3.8–10.8)

## 2021-11-27 NOTE — Progress Notes (Signed)
GYNECOLOGY  VISIT ?  ?HPI: ?26 y.o.   Single  African American  female   ?G0P0000 with No LMP recorded (exact date). (Menstrual status: Irregular Periods).   ?here for PUS for irregular cycles. ? ?Her period started in February, 2022, and has never stopped.  ?Seen on 10/25/21 by NP.  ?Hgb 8.4. ?She was referred to hematology for visit on 12/05/21.  ?She does endorse palpitations and is feeling dizzy.  ? ?When she is not taking pills, she has heavy bleeding and pain, but menstrual bleeding never stops completely even while on birth control.  ?No missed pills. ?Pad change 4 times a day. ? ?Multiple prior ultrasounds in Epic and outside office showing potential submucous fibroid measuring 2 - 3 cm. ?Hx hysteroscopic polypectomy in May, 2022, but this did not improve her bleeding.  ?Her final pathology report showed endometrial polyp and no fibroid. ?She was diagnosed with endometritis post op following the hysteroscopy.  ? ?She is not sexually active since February, 2022.  ? ?GYNECOLOGIC HISTORY: ?No LMP recorded (exact date). (Menstrual status: Irregular Periods). ?Contraception:  OCPs--Yaz ?Menopausal hormone therapy:  n/a ?Last mammogram:  n/a ?Last pap smear:   11-01-20 Neg:Neg HR HPv ?       ?OB History   ? ? Gravida  ?0  ? Para  ?0  ? Term  ?0  ? Preterm  ?0  ? AB  ?0  ? Living  ?0  ?  ? ? SAB  ?0  ? IAB  ?0  ? Ectopic  ?0  ? Multiple  ?0  ? Live Births  ?0  ?   ?  ?  ?    ? ?Patient Active Problem List  ? Diagnosis Date Noted  ? Thickened endometrium   ? Refugee health examination 02/09/2013  ? Dysmenorrhea 02/09/2013  ? Pediatric body mass index (BMI) of 5th percentile to less than 85th percentile for age 18/24/2014  ? ? ?Past Medical History:  ?Diagnosis Date  ? Abnormal uterine bleeding (AUB) 10/16/2020  ? Anemia   ? Fibroid   ? Medical history non-contributory   ? ? ?Past Surgical History:  ?Procedure Laterality Date  ? HYSTEROSCOPY WITH D & C N/A 12/20/2020  ? Procedure: DILATATION AND CURETTAGE /HYSTEROSCOPY  with polyp removal;  Surgeon: Chancy Milroy, MD;  Location: [facility];  Service: Gynecology;  Laterality: N/A;  ? NO PAST SURGERIES    ? ? ?Current Outpatient Medications  ?Medication Sig Dispense Refill  ? drospirenone-ethinyl estradiol (YASMIN 28) 3-0.03 MG tablet Take 1 tablet by mouth daily. 84 tablet 1  ? ferrous sulfate (FERROUSUL) 325 (65 FE) MG tablet Take 1 tablet (325 mg total) by mouth 2 (two) times daily. 60 tablet 1  ? ibuprofen (ADVIL) 800 MG tablet Take 1 tablet (800 mg total) by mouth every 8 (eight) hours as needed. 30 tablet 0  ? ?No current facility-administered medications for this visit.  ?  ? ?ALLERGIES: Patient has no known allergies. ? ?Family History  ?Problem Relation Age of Onset  ? Heart disease Brother   ? ? ?Social History  ? ?Socioeconomic History  ? Marital status: Single  ?  Spouse name: Not on file  ? Number of children: Not on file  ? Years of education: Not on file  ? Highest education level: Not on file  ?Occupational History  ? Not on file  ?Tobacco Use  ? Smoking status: Never  ? Smokeless tobacco: Never  ?Vaping Use  ?  Vaping Use: Never used  ?Substance and Sexual Activity  ? Alcohol use: Yes  ?  Comment: occasional  ? Drug use: No  ? Sexual activity: Not Currently  ?  Partners: Male  ?  Birth control/protection: Abstinence, None  ?Other Topics Concern  ? Not on file  ?Social History Narrative  ? Not on file  ? ?Social Determinants of Health  ? ?Financial Resource Strain: Not on file  ?Food Insecurity: Not on file  ?Transportation Needs: Not on file  ?Physical Activity: Not on file  ?Stress: Not on file  ?Social Connections: Not on file  ?Intimate Partner Violence: Not on file  ? ? ?Review of Systems  ?Genitourinary:  Positive for menstrual problem.  ?All other systems reviewed and are negative. ? ?PHYSICAL EXAMINATION:   ? ?BP 100/70   Pulse 80   Ht 5' 3.75" (1.619 m)   Wt 120 lb (54.4 kg)   LMP  (Exact Date)   BMI 20.76 kg/m?     ?General appearance:  alert, cooperative and appears stated age ?  ?Pelvic: External genitalia:  no lesions ?             Urethra:  normal appearing urethra with no masses, tenderness or lesions ?             Bartholins and Skenes: normal    ?             Vagina: normal appearing vagina with normal color and discharge, no lesions ?             Cervix: no lesions.  Blood from os.  ?               ?Bimanual Exam:  Uterus:  normal size, contour, position, consistency, mobility, non-tender, retroverted.  ?             Adnexa: no mass, fullness, tenderness ?            ?Chaperone was present for exam:  Estill Bamberg, CMA ? ?Pelvic US ?Uterus 7.95 x 4.9 x 4.39 cm.  ?2.51 cm fibroid, submucosal.  ?EMS 28.47 mm.  ?Ovaries normal.   ?No adnexal masses.  ?Moderate to large volume of free fluid.  ? ?ASSESSMENT ? ?Uterine fibroid.  Looks submucosal.   ?Prior ultrasound images have similar finding.  ?Status post hysteroscopy with polypectomy, May 2022.  ?Anemia. ? ?PLAN ? ?CBC.  ?OK to continue birth control pills.  ?Keep appointment with hematology.  ?Return for saline ultrasound.  ?I anticipate hysteroscopic myomectomy but recommend the sonohysterogram due to lack of correlation with prior ultrasounds and intraoperative hysteroscopic findings.  ?  ?An After Visit Summary was printed and given to the patient. ? ?32 min  total time was spent for this patient encounter, including preparation, face-to-face counseling with the patient, coordination of care, and documentation of the encounter. ? ? ?

## 2021-11-28 ENCOUNTER — Other Ambulatory Visit: Payer: Self-pay

## 2021-11-28 DIAGNOSIS — D219 Benign neoplasm of connective and other soft tissue, unspecified: Secondary | ICD-10-CM

## 2021-11-28 NOTE — Progress Notes (Signed)
GYNECOLOGY  VISIT ?  ?HPI: ?26 y.o.   Single  African American  female   ?G0P0000 with No LMP recorded. (Menstrual status: Irregular Periods).   ?here for Sonohysterogram.    ?Pelvic US 4/11/ 23 suggested submucous fibroid. ?She has had irregular menstrual cycles and anemia. ?She takes birth control pills.  ? ?She has a history of a hysteroscopic polypectomy in May, 2022.  ?No fibroid was encountered.  ? ?UPT negative.    ? ?GYNECOLOGIC HISTORY: ?No LMP recorded. (Menstrual status: Irregular Periods). ?Contraception:  OCPs-Yaz ?Menopausal hormone therapy:  n/a ?Last mammogram:  n/a ?Last pap smear:    11-01-20 Neg:Neg HR HPv ?       ?OB History   ? ? Gravida  ?0  ? Para  ?0  ? Term  ?0  ? Preterm  ?0  ? AB  ?0  ? Living  ?0  ?  ? ? SAB  ?0  ? IAB  ?0  ? Ectopic  ?0  ? Multiple  ?0  ? Live Births  ?0  ?   ?  ?  ?    ? ?Patient Active Problem List  ? Diagnosis Date Noted  ? Thickened endometrium   ? Refugee health examination 02/09/2013  ? Dysmenorrhea 02/09/2013  ? Pediatric body mass index (BMI) of 5th percentile to less than 85th percentile for age 03/11/2013  ? ? ?Past Medical History:  ?Diagnosis Date  ? Abnormal uterine bleeding (AUB) 10/16/2020  ? Anemia   ? Fibroid   ? Medical history non-contributory   ? ? ?Past Surgical History:  ?Procedure Laterality Date  ? HYSTEROSCOPY WITH D & C N/A 12/20/2020  ? Procedure: DILATATION AND CURETTAGE /HYSTEROSCOPY with polyp removal;  Surgeon: Chancy Milroy, MD;  Location: [place];  Service: Gynecology;  Laterality: N/A;  ? NO PAST SURGERIES    ? ? ?Current Outpatient Medications  ?Medication Sig Dispense Refill  ? drospirenone-ethinyl estradiol (YASMIN 28) 3-0.03 MG tablet Take 1 tablet by mouth daily. 84 tablet 1  ? ferrous sulfate (FERROUSUL) 325 (65 FE) MG tablet Take 1 tablet (325 mg total) by mouth 2 (two) times daily. 60 tablet 1  ? ibuprofen (ADVIL) 800 MG tablet Take 1 tablet (800 mg total) by mouth every 8 (eight) hours as needed. 30 tablet 0   ? ?No current facility-administered medications for this visit.  ?  ? ?ALLERGIES: Patient has no known allergies. ? ?Family History  ?Problem Relation Age of Onset  ? Heart disease Brother   ? ? ?Social History  ? ?Socioeconomic History  ? Marital status: Single  ?  Spouse name: Not on file  ? Number of children: Not on file  ? Years of education: Not on file  ? Highest education level: Not on file  ?Occupational History  ? Not on file  ?Tobacco Use  ? Smoking status: Never  ? Smokeless tobacco: Never  ?Vaping Use  ? Vaping Use: Never used  ?Substance and Sexual Activity  ? Alcohol use: Yes  ?  Comment: occasional  ? Drug use: No  ? Sexual activity: Not Currently  ?  Partners: Male  ?  Birth control/protection: Abstinence, None  ?Other Topics Concern  ? Not on file  ?Social History Narrative  ? Not on file  ? ?Social Determinants of Health  ? ?Financial Resource Strain: Not on file  ?Food Insecurity: Not on file  ?Transportation Needs: Not on file  ?Physical Activity: Not on file  ?Stress:  Not on file  ?Social Connections: Not on file  ?Intimate Partner Violence: Not on file  ? ? ?Review of Systems  ?All other systems reviewed and are negative. ? ?PHYSICAL EXAMINATION:   ? ?BP 100/64   Pulse 84   Ht 5' 3.75" (1.619 m)   Wt 120 lb (54.4 kg)   SpO2 99%   BMI 20.76 kg/m?     ?General appearance: alert, cooperative and appears stated age ?Lungs: clear to auscultation bilaterally ?Heart: regular rate and rhythm ? ?Sonohysterogram ?Consent for procedure.  ?Sterile prep with betadine.   ?Cannula placed and sterile saline injected.  ?3.3 x 2.3 cm intracavitary mass noted, suggestive of submucous fibroid.  ?  ?ASSESSMENT ? ?Abnormal uterine bleeding on COCs. ?Endometrial mass, Suggestive of submucous fibroid.  ?Status post hysteroscopic polypectomy.  ?Anemia.  ? ?PLAN ? ?Discussion of hysteroscopy with Myosure removal of endometrial mass, dilation and curettage.  ?Risks, benefits, and alternatives reviewed. ?Risks  include but are not limited to bleeding, infection, damage to surrounding organs including uterine perforation requiring hospitalization and laparoscopy, pulmonary edema, reaction to anesthesia, DVT, PE, death, need for further treatment and surgery including repeat hysteroscopy.  She understands that due to the size of the fibroid, she may need a multistage hysteroscopy.  ?Procedure explained.  ?Patient wishes to proceed. ? ?Patient will take her iron twice daily.  ? ?She will proceed forward with her hematology consultation.  ? ?Ok to continue with COCs. ? ?An After Visit Summary was printed and given to the patient. ? ?35 min  total time was spent for this patient encounter, including preparation, face-to-face counseling with the patient, coordination of care, and documentation of the encounter. ? ? ?

## 2021-11-29 ENCOUNTER — Telehealth: Payer: Self-pay | Admitting: Obstetrics and Gynecology

## 2021-11-29 ENCOUNTER — Encounter: Payer: Self-pay | Admitting: Obstetrics and Gynecology

## 2021-11-29 ENCOUNTER — Ambulatory Visit (INDEPENDENT_AMBULATORY_CARE_PROVIDER_SITE_OTHER): Payer: 59 | Admitting: Obstetrics and Gynecology

## 2021-11-29 ENCOUNTER — Ambulatory Visit (INDEPENDENT_AMBULATORY_CARE_PROVIDER_SITE_OTHER): Payer: 59

## 2021-11-29 VITALS — BP 100/64 | HR 84 | Ht 63.75 in | Wt 120.0 lb

## 2021-11-29 DIAGNOSIS — N939 Abnormal uterine and vaginal bleeding, unspecified: Secondary | ICD-10-CM

## 2021-11-29 DIAGNOSIS — N9489 Other specified conditions associated with female genital organs and menstrual cycle: Secondary | ICD-10-CM | POA: Diagnosis not present

## 2021-11-29 DIAGNOSIS — Z01812 Encounter for preprocedural laboratory examination: Secondary | ICD-10-CM | POA: Diagnosis not present

## 2021-11-29 DIAGNOSIS — D219 Benign neoplasm of connective and other soft tissue, unspecified: Secondary | ICD-10-CM | POA: Diagnosis not present

## 2021-11-29 LAB — PREGNANCY, URINE: Preg Test, Ur: NEGATIVE

## 2021-11-29 NOTE — Telephone Encounter (Signed)
Please precert and schedule hysteroscopic myomectomy.  ? ?My patient has abnormal uterine bleeding and a 3 cm intracavitary mass, suspected to be a submucous fibroid.  ? ?Surgery will be at [facility].  ?Time needed is 2 hours.  ? ?No preop needed. ? ? ?

## 2021-12-03 ENCOUNTER — Inpatient Hospital Stay: Payer: 59 | Attending: Nurse Practitioner

## 2021-12-03 DIAGNOSIS — D709 Neutropenia, unspecified: Secondary | ICD-10-CM | POA: Insufficient documentation

## 2021-12-03 DIAGNOSIS — Z793 Long term (current) use of hormonal contraceptives: Secondary | ICD-10-CM | POA: Insufficient documentation

## 2021-12-03 DIAGNOSIS — D250 Submucous leiomyoma of uterus: Secondary | ICD-10-CM | POA: Insufficient documentation

## 2021-12-03 DIAGNOSIS — D500 Iron deficiency anemia secondary to blood loss (chronic): Secondary | ICD-10-CM | POA: Insufficient documentation

## 2021-12-03 DIAGNOSIS — Z79899 Other long term (current) drug therapy: Secondary | ICD-10-CM | POA: Insufficient documentation

## 2021-12-03 DIAGNOSIS — N920 Excessive and frequent menstruation with regular cycle: Secondary | ICD-10-CM | POA: Insufficient documentation

## 2021-12-03 NOTE — Telephone Encounter (Signed)
Spoke with patient. Reviewed surgery dates. Patient request to proceed with surgery on 12/24/21.  Advised patient I will forward to business office for return call. I will return call once surgery date and time confirmed. Patient verbalizes understanding and is agreeable.  ? ? ? ?

## 2021-12-03 NOTE — Telephone Encounter (Signed)
Spoke with patient regarding surgery benefits. Patient acknowledges understanding of information presented. Patient is aware that benefits presented are professional benefits only. Patient is aware the hospital will call with facility benefits. See account note.  Routing to Jill Hamm, RN.  

## 2021-12-03 NOTE — Telephone Encounter (Signed)
Left message to call Beauregard Jarrells, RN at [facility], [phone], OPT 5.  

## 2021-12-04 NOTE — Progress Notes (Signed)
New Hematology/Oncology Consult ? ? ?Requesting MD: Rubbie Battiest, NP ? ?[phone] ? ?   ? ?Reason for Consult: Iron deficiency anemia ? ?HPI: Gina Burke is a 26 year old woman referred for evaluation of iron deficiency anemia.   She was seen by Rubbie Battiest NP on 10/25/2021 for evaluation of irregular bleeding.  She was started on oral contraceptives and referred for a transvaginal ultrasound.  Labs were obtained with results as follows:  hemoglobin 8.4, MCV 68, RDW 15, ferritin 2, normal B12 and folate, negative urine pregnancy test.  Transvaginal ultrasound 11/27/2021: Suspected submucous fibroid.  Sonohysterogram 11/29/2021: 3.3 x 2.3 cm intracavitary mass noted, suggestive of submucosal fibroid.  She is scheduled to undergo hysteroscopic myomectomy 12/24/2021. ? ?Review of labs in the EMR: 01/12/2021 hemoglobin 9.4, MCV 83; 11/23/2020 hemoglobin 9.6, MCV 92; 11/01/2020 hemoglobin 12.8, MCV 92; 10/26/2019 hemoglobin 13.4, MCV 93.3. ? ? ? ? ?Past Medical History:  ?Diagnosis Date  ? Abnormal uterine bleeding (AUB) 10/16/2020  ? Anemia   ? Fibroid   ? Medical history non-contributory   ? ? ?Past Surgical History:  ?Procedure Laterality Date  ? HYSTEROSCOPY WITH D & C N/A 12/20/2020  ? Procedure: DILATATION AND CURETTAGE /HYSTEROSCOPY with polyp removal;  Surgeon: Chancy Milroy, MD;  Location: [facility];  Service: Gynecology;  Laterality: N/A;  ? NO PAST SURGERIES    ? ? ? ?Current Outpatient Medications:  ?  drospirenone-ethinyl estradiol (YASMIN 28) 3-0.03 MG tablet, Take 1 tablet by mouth daily., Disp: 84 tablet, Rfl: 1 ?  ferrous sulfate 325 (65 FE) MG EC tablet, Take 1 tablet (325 mg total) by mouth 3 (three) times daily with meals., Disp: 90 tablet, Rfl: 3 ?  senna-docusate (SENOKOT-S) 8.6-50 MG tablet, Take 1 tablet by mouth 2 (two) times daily., Disp: 60 tablet, Rfl: 0 ?  ibuprofen (ADVIL) 800 MG tablet, Take 1 tablet (800 mg total) by mouth every 8 (eight) hours as needed. (Patient not  taking: Reported on 12/05/2021), Disp: 30 tablet, Rfl: 0: ? ? ? ?No Known Allergies: ? ?FH: Mother and father deceased.  There is no family history of cancer.  No family history of a blood disorder. ? ?SOCIAL HISTORY: She is originally from [place].  She lives in [place] with her brother.  She is in school to become a Psychologist, sport and exercise.  No tobacco use.  Occasional EtOH intake. ? ?Review of Systems: Energy is poor.  She notes that her heart beats fast with exertion.  She has dyspnea on exertion.  Only bleeding she is aware of is related to her menstrual cycle.  In February 2022 the menstrual cycle changed, became very heavy and prolonged.  She reports being found to have a "polyp" which was removed.  She was started on oral contraceptives.  She noted mild improvement in the menstrual cycle but the bleeding continued.  She reports being found to have uterine fibroids in November 2022.  She is scheduled for surgery on Dec 24, 2021.  She took oral iron until August 2022.  She discontinued at that point due to switching providers.  She was not aware she should continue the oral iron.  She had constipation with the iron and noted some nausea if she took on an empty stomach.  No change in bowel habits.  No bloody or black bowel movements.  No hematuria.  She craves clay.  She eats a regular diet.  No lip/tongue soreness.  Nails are not brittle.  No chest pain.  She reports a  decrease in appetite coinciding with the change in her menstrual cycle in February 2022.  She estimates 20 pounds of weight loss over the past year or so. ? ?Physical Exam: ? ?Blood pressure (!) 110/58, pulse 76, temperature 98.1 ?F (36.7 ?C), temperature source Oral, resp. rate 18, height 5' 3.75" (1.619 m), weight 120 lb 6.4 oz (54.6 kg), SpO2 100 %. ? ?HEENT: Conjunctival pallor.  No thrush or ulcers. ?Lungs: Lungs clear bilaterally. ?Cardiac: Regular, tachycardic. ?Abdomen: Abdomen soft and nontender.  No hepatomegaly.  No splenomegaly. ?Vascular:  No leg edema. ?Lymph nodes: No palpable cervical, supraclavicular, axillary or inguinal lymph nodes. ? ?LABS: ? ? ?Recent Labs  ?  12/05/21 ?1147  ?WBC 4.4  ?HGB 9.5*  ?HCT 34.5*  ?PLT 301  ? ? ?Peripheral blood smear: The white cell morphology is unremarkable.  The platelets appear normal in number.  The Red cell morphology cannot be evaluated due to severe water artifact (we will review another blood smear if the anemia does not correct with iron replacement) ? ? ?RADIOLOGY: ? ?US Transvaginal Non-OB ? ?Result Date: 12/02/2021 ?Indication:  Irregular menses on combined oral contraception. Large endometrial mass seen on outside pelvic ultrasound. History of hysteroscopic polypectomy in May, 2022. Pelvic US Uterus 7.95 x 4.9 x 4.39 cm. 2.51 cm fibroid, submucosal. EMS 28.47 mm. Ovaries normal.  No adnexal masses. Moderate to large volume of free fluid. Impression:  Suspected submucous fibroid.  ? ?[place] Sonohysterogram ? ?Result Date: 12/02/2021 ?Indication:  Suspected submucous fibroid. Fibroid not previously identified on prior hysterscopic procedure. Sonohysterogram Consent for procedure. Sterile prep with betadine.  Cannula placed and sterile saline injected. 3.3 x 2.3 cm intracavitary mass noted, suggestive of submucous fibroid. Impression:  Submucous fibroid confirmed.   ? ?Assessment and Plan:  ? ?Iron deficiency anemia likely due to menorrhagia ?Menorrhagia felt to be due to fibroids, scheduled for hysteroscopic myomectomy 12/24/2021 ?Mild neutropenia-chronic ? ?Gina Burke was referred for evaluation of iron deficiency anemia.  This is likely due to menorrhagia.  She is scheduled for a myomectomy procedure in 3 weeks.  She has taken oral iron in the past.  She will resume ferrous sulfate 325 mg 3 times daily.  She will take Senokot S 1 tablet twice daily.  If she is unable to tolerate the oral iron due to constipation she will call the office and we will make arrangements for IV iron.  We discussed the potential  for an allergic reaction and anaphylaxis with IV iron. ? ?She will return for lab and follow-up in 2 weeks.  We are available to see her sooner if needed. ? ?Patient seen with Dr. Benay Spice. ? ? ? ? ? ?Ned Card, NP ?12/05/2021, 1:00 PM  ?This was a shared visit with Ned Card.  Gina Burke was interviewed and examined.  I reviewed the peripheral blood smear.  She is referred for evaluation of anemia.  She appears to have iron deficiency anemia due to menorrhagia.  She will begin oral iron replacement.  We will consider a trial of IV iron if the anemia does not correct with oral iron and the myomectomy procedure. ? ?The mild neutropenia is likely a benign normal variant.  Review of the medical chart reveals a chronic history of mild neutropenia. ? ?I was present for greater than 50% of today's visit.  I performed medical decision making. ? ?Julieanne Manson, MD ?

## 2021-12-05 ENCOUNTER — Inpatient Hospital Stay (HOSPITAL_BASED_OUTPATIENT_CLINIC_OR_DEPARTMENT_OTHER): Payer: 59 | Admitting: Nurse Practitioner

## 2021-12-05 ENCOUNTER — Encounter: Payer: Self-pay | Admitting: Nurse Practitioner

## 2021-12-05 ENCOUNTER — Inpatient Hospital Stay: Payer: 59

## 2021-12-05 ENCOUNTER — Other Ambulatory Visit: Payer: Self-pay | Admitting: Nurse Practitioner

## 2021-12-05 VITALS — BP 110/58 | HR 76 | Temp 98.1°F | Resp 18 | Ht 63.75 in | Wt 120.4 lb

## 2021-12-05 DIAGNOSIS — N920 Excessive and frequent menstruation with regular cycle: Secondary | ICD-10-CM

## 2021-12-05 DIAGNOSIS — D250 Submucous leiomyoma of uterus: Secondary | ICD-10-CM | POA: Diagnosis not present

## 2021-12-05 DIAGNOSIS — Z79899 Other long term (current) drug therapy: Secondary | ICD-10-CM

## 2021-12-05 DIAGNOSIS — D500 Iron deficiency anemia secondary to blood loss (chronic): Secondary | ICD-10-CM

## 2021-12-05 DIAGNOSIS — D709 Neutropenia, unspecified: Secondary | ICD-10-CM | POA: Diagnosis not present

## 2021-12-05 DIAGNOSIS — D509 Iron deficiency anemia, unspecified: Secondary | ICD-10-CM

## 2021-12-05 DIAGNOSIS — N946 Dysmenorrhea, unspecified: Secondary | ICD-10-CM

## 2021-12-05 DIAGNOSIS — Z793 Long term (current) use of hormonal contraceptives: Secondary | ICD-10-CM | POA: Diagnosis not present

## 2021-12-05 LAB — URINALYSIS, COMPLETE (UACMP) WITH MICROSCOPIC
Bilirubin Urine: NEGATIVE
Glucose, UA: NEGATIVE mg/dL
Ketones, ur: NEGATIVE mg/dL
Leukocytes,Ua: NEGATIVE
Nitrite: NEGATIVE
Specific Gravity, Urine: 1.028 (ref 1.005–1.030)
pH: 6 (ref 5.0–8.0)

## 2021-12-05 LAB — CBC WITH DIFFERENTIAL (CANCER CENTER ONLY)
Abs Immature Granulocytes: 0 10*3/uL (ref 0.00–0.07)
Basophils Absolute: 0 10*3/uL (ref 0.0–0.1)
Basophils Relative: 1 %
Eosinophils Absolute: 0.4 10*3/uL (ref 0.0–0.5)
Eosinophils Relative: 8 %
HCT: 34.5 % — ABNORMAL LOW (ref 36.0–46.0)
Hemoglobin: 9.5 g/dL — ABNORMAL LOW (ref 12.0–15.0)
Immature Granulocytes: 0 %
Lymphocytes Relative: 52 %
Lymphs Abs: 2.3 10*3/uL (ref 0.7–4.0)
MCH: 19.6 pg — ABNORMAL LOW (ref 26.0–34.0)
MCHC: 27.5 g/dL — ABNORMAL LOW (ref 30.0–36.0)
MCV: 71.1 fL — ABNORMAL LOW (ref 80.0–100.0)
Monocytes Absolute: 0.4 10*3/uL (ref 0.1–1.0)
Monocytes Relative: 9 %
Neutro Abs: 1.3 10*3/uL — ABNORMAL LOW (ref 1.7–7.7)
Neutrophils Relative %: 30 %
Platelet Count: 301 10*3/uL (ref 150–400)
RBC: 4.85 MIL/uL (ref 3.87–5.11)
RDW: 20.4 % — ABNORMAL HIGH (ref 11.5–15.5)
WBC Count: 4.4 10*3/uL (ref 4.0–10.5)
nRBC: 0 % (ref 0.0–0.2)

## 2021-12-05 LAB — FERRITIN: Ferritin: 10 ng/mL — ABNORMAL LOW (ref 11–307)

## 2021-12-05 LAB — SAVE SMEAR(SSMR), FOR PROVIDER SLIDE REVIEW

## 2021-12-05 MED ORDER — SENNOSIDES-DOCUSATE SODIUM 8.6-50 MG PO TABS: 1.0000 | ORAL_TABLET | Freq: Two times a day (BID) | ORAL | 0 refills | Status: DC

## 2021-12-05 MED ORDER — FERROUS SULFATE 325 (65 FE) MG PO TBEC: 325.0000 mg | DELAYED_RELEASE_TABLET | Freq: Three times a day (TID) | ORAL | 3 refills | Status: DC

## 2021-12-06 NOTE — Telephone Encounter (Signed)
Spoke with patient. Surgery date request confirmed.  ?Advised surgery is scheduled for 12/24/21, [place] at [facility].  ?Surgery instruction sheet and hospital brochure reviewed, printed copy will be mailed.  ?Patient verbalizes understanding and is agreeable.  ?Routing to provider. Encounter closed.  ? ?Cc: Hayley Carder ? ? ?

## 2021-12-19 ENCOUNTER — Encounter: Payer: Self-pay | Admitting: Nurse Practitioner

## 2021-12-19 ENCOUNTER — Inpatient Hospital Stay: Payer: 59 | Admitting: Nurse Practitioner

## 2021-12-19 ENCOUNTER — Inpatient Hospital Stay: Payer: 59 | Attending: Nurse Practitioner

## 2021-12-19 VITALS — BP 110/78 | HR 84 | Temp 98.2°F | Resp 18 | Wt 118.2 lb

## 2021-12-19 DIAGNOSIS — R319 Hematuria, unspecified: Secondary | ICD-10-CM | POA: Diagnosis not present

## 2021-12-19 DIAGNOSIS — D509 Iron deficiency anemia, unspecified: Secondary | ICD-10-CM | POA: Diagnosis not present

## 2021-12-19 DIAGNOSIS — N946 Dysmenorrhea, unspecified: Secondary | ICD-10-CM

## 2021-12-19 DIAGNOSIS — D709 Neutropenia, unspecified: Secondary | ICD-10-CM | POA: Diagnosis not present

## 2021-12-19 DIAGNOSIS — D500 Iron deficiency anemia secondary to blood loss (chronic): Secondary | ICD-10-CM | POA: Diagnosis present

## 2021-12-19 DIAGNOSIS — N920 Excessive and frequent menstruation with regular cycle: Secondary | ICD-10-CM | POA: Insufficient documentation

## 2021-12-19 LAB — URINALYSIS, COMPLETE (UACMP) WITH MICROSCOPIC
Bilirubin Urine: NEGATIVE
Glucose, UA: NEGATIVE mg/dL
Ketones, ur: NEGATIVE mg/dL
Leukocytes,Ua: NEGATIVE
Nitrite: NEGATIVE
Protein, ur: 30 mg/dL — AB
Specific Gravity, Urine: 1.03 (ref 1.005–1.030)
pH: 6 (ref 5.0–8.0)

## 2021-12-19 LAB — CBC WITH DIFFERENTIAL (CANCER CENTER ONLY)
Abs Immature Granulocytes: 0.01 10*3/uL (ref 0.00–0.07)
Basophils Absolute: 0 10*3/uL (ref 0.0–0.1)
Basophils Relative: 0 %
Eosinophils Absolute: 0.1 10*3/uL (ref 0.0–0.5)
Eosinophils Relative: 4 %
HCT: 34.7 % — ABNORMAL LOW (ref 36.0–46.0)
Hemoglobin: 10 g/dL — ABNORMAL LOW (ref 12.0–15.0)
Immature Granulocytes: 0 %
Lymphocytes Relative: 42 %
Lymphs Abs: 1.3 10*3/uL (ref 0.7–4.0)
MCH: 21.1 pg — ABNORMAL LOW (ref 26.0–34.0)
MCHC: 28.8 g/dL — ABNORMAL LOW (ref 30.0–36.0)
MCV: 73.1 fL — ABNORMAL LOW (ref 80.0–100.0)
Monocytes Absolute: 0.4 10*3/uL (ref 0.1–1.0)
Monocytes Relative: 14 %
Neutro Abs: 1.2 10*3/uL — ABNORMAL LOW (ref 1.7–7.7)
Neutrophils Relative %: 40 %
Platelet Count: 280 10*3/uL (ref 150–400)
RBC: 4.75 MIL/uL (ref 3.87–5.11)
RDW: 22.5 % — ABNORMAL HIGH (ref 11.5–15.5)
WBC Count: 3.1 10*3/uL — ABNORMAL LOW (ref 4.0–10.5)
nRBC: 0 % (ref 0.0–0.2)

## 2021-12-19 LAB — FERRITIN: Ferritin: 18 ng/mL (ref 11–307)

## 2021-12-19 NOTE — Progress Notes (Signed)
?  [facility] ?OFFICE PROGRESS NOTE ? ? ?Diagnosis: Iron deficiency anemia ? ?INTERVAL HISTORY:  ? ?Ms. Brashears returns as scheduled.  She is taking ferrous sulfate 3 times a day.  Bowels moving regularly with the aid of prune juice.  No nausea or vomiting but she does note a decrease in appetite.  She reports continual menstrual bleeding.  She is scheduled for the myomectomy 12/24/2021. ? ?Objective: ? ?Vital signs in last 24 hours: ? ?Blood pressure 110/78, pulse 84, temperature 98.2 ?F (36.8 ?C), temperature source Tympanic, resp. rate 18, weight 118 lb 3.2 oz (53.6 kg), SpO2 100 %. ?  ? ?Resp: Lungs clear bilaterally. ?Cardio: Regular rate and rhythm. ?GI: Abdomen soft and nontender.  No hepatosplenomegaly. ?Vascular: No leg edema. ? ?Lab Results: ? ?Lab Results  ?Component Value Date  ? WBC 3.1 (L) 12/19/2021  ? HGB 10.0 (L) 12/19/2021  ? HCT 34.7 (L) 12/19/2021  ? MCV 73.1 (L) 12/19/2021  ? PLT 280 12/19/2021  ? NEUTROABS 1.2 (L) 12/19/2021  ? ? ?Imaging: ? ?No results found. ? ?Medications: I have reviewed the patient's current medications. ? ?Assessment/Plan: ?Iron deficiency anemia likely due to menorrhagia ?12/05/2021 ferritin 10, urinalysis with small amount of hemoglobin ?Ferrous sulfate 3 times daily beginning 12/05/2021 ?12/19/2021 hemoglobin 10.0, MCV 73, urine with large amount of hemoglobin ?Oral iron continued ?Menorrhagia felt to be due to fibroids, scheduled for hysteroscopic myomectomy 12/24/2021 ?Mild neutropenia-chronic ? ?Disposition: Ms. Reynolds appears stable.  Hemoglobin is better.  She will continue oral iron, decrease to twice daily due to a decrease in appetite.  She is scheduled for the myomectomy procedure 12/24/2021. ? ?She has stable mild neutropenia. ? ?The hemoglobin in her urine is likely due to menstrual bleeding.  We will repeat a urinalysis at her next visit. ? ?She will return for lab and follow-up in approximately 4 weeks. ? ? ? ?Ned Card ANP/GNP-BC  ? ?12/19/2021  ?1:40  PM ? ? ? ? ? ? ? ?

## 2021-12-20 ENCOUNTER — Other Ambulatory Visit: Payer: Self-pay

## 2021-12-20 ENCOUNTER — Encounter (HOSPITAL_BASED_OUTPATIENT_CLINIC_OR_DEPARTMENT_OTHER): Payer: Self-pay | Admitting: Obstetrics and Gynecology

## 2021-12-20 NOTE — Progress Notes (Signed)
Spoke w/ via phone for pre-op interview: patient ?Lab needs dos: T&S, BMP, UPT ?Lab results: CBCD 12/19/21 ?COVID test: patient states asymptomatic no test needed. ?Arrive at 0530 ?NPO after MN except clear liquids.Clear liquids from MN until 0430 ?Med rec completed. ?Medications to take morning of surgery: none ?Diabetic medication: NA ?Patient instructed no nail polish to be worn day of surgery. ?Patient instructed to bring photo id and insurance card day of surgery. ?Patient aware to have Driver (ride ) / caregiver for 24 hours after surgery. ?Patient Special Instructions: NA ?Pre-Op special Istructions:NA ?Patient verbalized understanding of instructions that were given at this phone interview. ?Patient denies shortness of breath, chest pain, fever, cough at this phone interview.  ?

## 2021-12-23 NOTE — Anesthesia Preprocedure Evaluation (Addendum)
Anesthesia Evaluation  ?Patient identified by MRN, date of birth, ID band ?Patient awake ? ? ? ?Reviewed: ?Allergy & Precautions, H&P , NPO status , Patient's Chart, lab work & pertinent test results ? ?Airway ?Mallampati: II ? ?TM Distance: >3 FB ?Neck ROM: Full ? ? ? Dental ?no notable dental hx. ?(+) Teeth Intact, Dental Advisory Given ?  ?Pulmonary ?neg pulmonary ROS,  ?  ?Pulmonary exam normal ?breath sounds clear to auscultation ? ? ? ? ? ? Cardiovascular ?Exercise Tolerance: Good ?negative cardio ROS ? ? ?Rhythm:Regular Rate:Normal ? ? ?  ?Neuro/Psych ?negative neurological ROS ? negative psych ROS  ? GI/Hepatic ?negative GI ROS, Neg liver ROS,   ?Endo/Other  ?negative endocrine ROS ? Renal/GU ?negative Renal ROS  ?negative genitourinary ?  ?Musculoskeletal ? ? Abdominal ?  ?Peds ? Hematology ? ?(+) Blood dyscrasia, anemia ,   ?Anesthesia Other Findings ? ? Reproductive/Obstetrics ?negative OB ROS ? ?  ? ? ? ? ? ? ? ? ? ? ? ? ? ?  ?  ? ? ? ? ? ? ? ?Anesthesia Physical ?Anesthesia Plan ? ?ASA: 2 ? ?Anesthesia Plan: General  ? ?Post-op Pain Management: Tylenol PO (pre-op)* and Toradol IV (intra-op)*  ? ?Induction: Intravenous ? ?PONV Risk Score and Plan: 4 or greater and Dexamethasone, Ondansetron and Midazolam ? ?Airway Management Planned: Oral ETT and LMA ? ?Additional Equipment:  ? ?Intra-op Plan:  ? ?Post-operative Plan: Extubation in OR ? ?Informed Consent: I have reviewed the patients History and Physical, chart, labs and discussed the procedure including the risks, benefits and alternatives for the proposed anesthesia with the patient or authorized representative who has indicated his/her understanding and acceptance.  ? ? ? ?Dental advisory given ? ?Plan Discussed with: CRNA ? ?Anesthesia Plan Comments:   ? ? ? ? ? ?Anesthesia Quick Evaluation ? ?

## 2021-12-24 ENCOUNTER — Encounter (HOSPITAL_BASED_OUTPATIENT_CLINIC_OR_DEPARTMENT_OTHER): Payer: Self-pay | Admitting: Obstetrics and Gynecology

## 2021-12-24 ENCOUNTER — Other Ambulatory Visit: Payer: Self-pay

## 2021-12-24 ENCOUNTER — Encounter (HOSPITAL_BASED_OUTPATIENT_CLINIC_OR_DEPARTMENT_OTHER)
Admission: RE | Disposition: A | Discharge status: Home / Self Care | Payer: Self-pay | Source: Home / Self Care | Attending: Obstetrics and Gynecology

## 2021-12-24 ENCOUNTER — Ambulatory Visit (HOSPITAL_BASED_OUTPATIENT_CLINIC_OR_DEPARTMENT_OTHER)
Admission: RE | Admit: 2021-12-24 | Discharge: 2021-12-24 | Disposition: A | Discharge status: Home / Self Care | Payer: 59 | Attending: Obstetrics and Gynecology | Admitting: Obstetrics and Gynecology

## 2021-12-24 ENCOUNTER — Ambulatory Visit (HOSPITAL_BASED_OUTPATIENT_CLINIC_OR_DEPARTMENT_OTHER): Payer: 59 | Admitting: Anesthesiology

## 2021-12-24 DIAGNOSIS — D250 Submucous leiomyoma of uterus: Secondary | ICD-10-CM

## 2021-12-24 DIAGNOSIS — N939 Abnormal uterine and vaginal bleeding, unspecified: Secondary | ICD-10-CM | POA: Diagnosis not present

## 2021-12-24 DIAGNOSIS — D649 Anemia, unspecified: Secondary | ICD-10-CM | POA: Diagnosis not present

## 2021-12-24 HISTORY — PX: HYSTEROSCOPY: SHX211

## 2021-12-24 LAB — TYPE AND SCREEN
ABO/RH(D): O POS
Antibody Screen: NEGATIVE

## 2021-12-24 LAB — BASIC METABOLIC PANEL
Anion gap: 11 (ref 5–15)
BUN: 8 mg/dL (ref 6–20)
CO2: 19 mmol/L — ABNORMAL LOW (ref 22–32)
Calcium: 9.5 mg/dL (ref 8.9–10.3)
Chloride: 110 mmol/L (ref 98–111)
Creatinine, Ser: 0.51 mg/dL (ref 0.44–1.00)
GFR, Estimated: >60 mL/min (ref >60)
Glucose, Bld: 80 mg/dL (ref 70–99)
Potassium: 3.6 mmol/L (ref 3.5–5.1)
Sodium: 140 mmol/L (ref 135–145)

## 2021-12-24 LAB — POCT PREGNANCY, URINE: Preg Test, Ur: NEGATIVE

## 2021-12-24 SURGERY — HYSTEROSCOPY
Anesthesia: General

## 2021-12-24 MED ORDER — ONDANSETRON HCL 4 MG/2ML IJ SOLN
INTRAMUSCULAR | Status: AC
  Filled 2021-12-24: qty 2

## 2021-12-24 MED ORDER — OXYCODONE-ACETAMINOPHEN 5-325 MG PO TABS: 1.0000 | ORAL_TABLET | ORAL | 0 refills | Status: DC | PRN

## 2021-12-24 MED ORDER — FENTANYL CITRATE (PF) 100 MCG/2ML IJ SOLN
INTRAMUSCULAR | Status: AC
  Filled 2021-12-24: qty 2

## 2021-12-24 MED ORDER — ONDANSETRON HCL 4 MG/2ML IJ SOLN
INTRAMUSCULAR | Status: DC | PRN
  Administered 2021-12-24: 4 mg via INTRAVENOUS

## 2021-12-24 MED ORDER — KETOROLAC TROMETHAMINE 30 MG/ML IJ SOLN
INTRAMUSCULAR | Status: DC | PRN
  Administered 2021-12-24: 30 mg via INTRAVENOUS

## 2021-12-24 MED ORDER — VASOPRESSIN 20 UNIT/ML IV SOLN
INTRAVENOUS | Status: DC | PRN
  Administered 2021-12-24: 2.98 [IU]

## 2021-12-24 MED ORDER — PROPOFOL 10 MG/ML IV BOLUS
INTRAVENOUS | Status: AC
Start: 2021-12-24 — End: ?
  Filled 2021-12-24: qty 20

## 2021-12-24 MED ORDER — IBUPROFEN 800 MG PO TABS: 800.0000 mg | ORAL_TABLET | Freq: Three times a day (TID) | ORAL | 0 refills | Status: DC | PRN

## 2021-12-24 MED ORDER — PHENYLEPHRINE 80 MCG/ML (10ML) SYRINGE FOR IV PUSH (FOR BLOOD PRESSURE SUPPORT)
PREFILLED_SYRINGE | INTRAVENOUS | Status: AC
  Filled 2021-12-24: qty 10

## 2021-12-24 MED ORDER — MIDAZOLAM HCL 5 MG/5ML IJ SOLN
INTRAMUSCULAR | Status: DC | PRN
Start: 2021-12-24 — End: 2021-12-24
  Administered 2021-12-24: 2 mg via INTRAVENOUS

## 2021-12-24 MED ORDER — SODIUM CHLORIDE (PF) 0.9 % IJ SOLN
INTRAMUSCULAR | Status: DC | PRN
  Administered 2021-12-24: 29.85 mL

## 2021-12-24 MED ORDER — PHENYLEPHRINE 80 MCG/ML (10ML) SYRINGE FOR IV PUSH (FOR BLOOD PRESSURE SUPPORT)
PREFILLED_SYRINGE | INTRAVENOUS | Status: DC | PRN
  Administered 2021-12-24 (×3): 80 ug via INTRAVENOUS

## 2021-12-24 MED ORDER — DEXAMETHASONE SODIUM PHOSPHATE 10 MG/ML IJ SOLN
INTRAMUSCULAR | Status: AC
  Filled 2021-12-24: qty 1

## 2021-12-24 MED ORDER — LACTATED RINGERS IV SOLN
INTRAVENOUS | Status: DC
  Administered 2021-12-24: 1000 mL via INTRAVENOUS

## 2021-12-24 MED ORDER — LACTATED RINGERS IV SOLN: INTRAVENOUS | Status: DC

## 2021-12-24 MED ORDER — ACETAMINOPHEN 500 MG PO TABS
1000.0000 mg | ORAL_TABLET | ORAL | Status: AC
  Administered 2021-12-24: 1000 mg via ORAL

## 2021-12-24 MED ORDER — MIDAZOLAM HCL 2 MG/2ML IJ SOLN
INTRAMUSCULAR | Status: AC
  Filled 2021-12-24: qty 2

## 2021-12-24 MED ORDER — ACETAMINOPHEN 500 MG PO TABS
ORAL_TABLET | ORAL | Status: AC
  Filled 2021-12-24: qty 2

## 2021-12-24 MED ORDER — DEXAMETHASONE SODIUM PHOSPHATE 10 MG/ML IJ SOLN
INTRAMUSCULAR | Status: DC | PRN
  Administered 2021-12-24: 5 mg via INTRAVENOUS

## 2021-12-24 MED ORDER — KETOROLAC TROMETHAMINE 30 MG/ML IJ SOLN
INTRAMUSCULAR | Status: AC
  Filled 2021-12-24: qty 1

## 2021-12-24 MED ORDER — HYDROMORPHONE HCL 1 MG/ML IJ SOLN: 0.2500 mg | INTRAMUSCULAR | Status: DC | PRN

## 2021-12-24 MED ORDER — PROPOFOL 10 MG/ML IV BOLUS
INTRAVENOUS | Status: DC | PRN
  Administered 2021-12-24: 150 mg via INTRAVENOUS
  Administered 2021-12-24: 30 mg via INTRAVENOUS

## 2021-12-24 MED ORDER — FENTANYL CITRATE (PF) 100 MCG/2ML IJ SOLN
INTRAMUSCULAR | Status: DC | PRN
  Administered 2021-12-24: 25 ug via INTRAVENOUS
  Administered 2021-12-24: 100 ug via INTRAVENOUS
  Administered 2021-12-24: 50 ug via INTRAVENOUS

## 2021-12-24 MED ORDER — SODIUM CHLORIDE 0.9 % IR SOLN
Status: DC | PRN
  Administered 2021-12-24: 3000 mL
  Administered 2021-12-24 (×2): 1000 mL
  Administered 2021-12-24 (×2): 3000 mL
  Administered 2021-12-24: 1000 mL
  Administered 2021-12-24: 3000 mL

## 2021-12-24 MED ORDER — LIDOCAINE HCL 1 % IJ SOLN
INTRAMUSCULAR | Status: DC | PRN
  Administered 2021-12-24: 10 mL

## 2021-12-24 MED ORDER — LIDOCAINE 2% (20 MG/ML) 5 ML SYRINGE
INTRAMUSCULAR | Status: DC | PRN
  Administered 2021-12-24: 60 mg via INTRAVENOUS

## 2021-12-24 MED ORDER — LIDOCAINE HCL (PF) 2 % IJ SOLN
INTRAMUSCULAR | Status: AC
  Filled 2021-12-24: qty 5

## 2021-12-24 SURGICAL SUPPLY — 26 items
CATH ROBINSON RED A/P 16FR (CATHETERS) ×2 IMPLANT
CNTNR URN SCR LID CUP LEK RST (MISCELLANEOUS) IMPLANT
CONT SPEC 4OZ STRL OR WHT (MISCELLANEOUS) ×2
DRSG TELFA 3X8 NADH (GAUZE/BANDAGES/DRESSINGS) ×2 IMPLANT
GLOVE BIO SURGEON STRL SZ 6.5 (GLOVE) ×2 IMPLANT
GLOVE BIOGEL PI IND STRL 7.0 (GLOVE) IMPLANT
GLOVE BIOGEL PI IND STRL 7.5 (GLOVE) IMPLANT
GLOVE BIOGEL PI INDICATOR 7.0 (GLOVE) ×1
GLOVE BIOGEL PI INDICATOR 7.5 (GLOVE) ×1
GLOVE SURG LTX SZ7 (GLOVE) ×3 IMPLANT
GOWN STRL REUS W/TWL LRG LVL3 (GOWN DISPOSABLE) ×3 IMPLANT
IV NS 1000ML (IV SOLUTION) ×6
IV NS 1000ML BAXH (IV SOLUTION) IMPLANT
IV NS IRRIG 3000ML ARTHROMATIC (IV SOLUTION) ×5 IMPLANT
KIT PROCEDURE FLUENT (KITS) ×2 IMPLANT
KIT TURNOVER CYSTO (KITS) ×2 IMPLANT
MYOSURE XL FIBROID (MISCELLANEOUS) ×2
NDL SPNL 22GX3.5 QUINCKE BK (NEEDLE) IMPLANT
NEEDLE SPNL 22GX3.5 QUINCKE BK (NEEDLE) ×2 IMPLANT
PACK VAGINAL MINOR WOMEN LF (CUSTOM PROCEDURE TRAY) ×2 IMPLANT
PAD DRESSING TELFA 3X8 NADH (GAUZE/BANDAGES/DRESSINGS) ×1 IMPLANT
PAD OB MATERNITY 4.3X12.25 (PERSONAL CARE ITEMS) ×2 IMPLANT
SEAL ROD LENS SCOPE MYOSURE (ABLATOR) ×2 IMPLANT
SYR CONTROL 10ML LL (SYRINGE) ×1 IMPLANT
SYSTEM TISS REMOVAL MYOSURE XL (MISCELLANEOUS) IMPLANT
TOWEL OR 17X26 10 PK STRL BLUE (TOWEL DISPOSABLE) ×2 IMPLANT

## 2021-12-24 NOTE — Anesthesia Postprocedure Evaluation (Signed)
Anesthesia Post Note ? ?Patient: Gina Burke ? ?Procedure(s) Performed: Hysteroscopic Myomectomy with Myosure/ Dilation and Curettage ? ?  ? ?Patient location during evaluation: PACU ?Anesthesia Type: General ?Level of consciousness: awake and alert ?Pain management: pain level controlled ?Vital Signs Assessment: post-procedure vital signs reviewed and stable ?Respiratory status: spontaneous breathing, nonlabored ventilation and respiratory function stable ?Cardiovascular status: blood pressure returned to baseline and stable ?Postop Assessment: no apparent nausea or vomiting ?Anesthetic complications: no ? ? ?No notable events documented. ? ?Last Vitals:  ?Vitals:  ? 12/24/21 0930 12/24/21 0958  ?BP: 115/81 102/72  ?Pulse: 92 78  ?Resp: 15 15  ?Temp:  36.4 ?C  ?SpO2: 100% 98%  ?  ?Last Pain:  ?Vitals:  ? 12/24/21 0958  ?TempSrc:   ?PainSc: 0-No pain  ? ? ?  ?  ?  ?  ?  ?  ? ?Kary Sugrue,W. EDMOND ? ? ? ? ?

## 2021-12-24 NOTE — Progress Notes (Signed)
Update to History and Physical ? ?No marked change in status since office preop visit.  ?Still having some bleeding.  ? ?Patient examined.  ?Vitals:  ? 12/24/21 0605  ?BP: 115/79  ?Pulse: 81  ?Resp: 15  ?Temp: (!) 97.3 ?F (36.3 ?C)  ?SpO2: 100%  ? ? ?CBC ?   ?Component Value Date/Time  ? WBC 3.1 (L) 12/19/2021 1312  ? WBC 5.6 11/27/2021 1602  ? RBC 4.75 12/19/2021 1312  ? HGB 10.0 (L) 12/19/2021 1312  ? HGB 12.8 11/01/2020 1409  ? HCT 34.7 (L) 12/19/2021 1312  ? HCT 38.0 11/01/2020 1409  ? PLT 280 12/19/2021 1312  ? PLT 218 11/01/2020 1409  ? MCV 73.1 (L) 12/19/2021 1312  ? MCV 92 11/01/2020 1409  ? MCH 21.1 (L) 12/19/2021 1312  ? MCHC 28.8 (L) 12/19/2021 1312  ? RDW 22.5 (H) 12/19/2021 1312  ? RDW 12.3 11/01/2020 1409  ? LYMPHSABS 1.3 12/19/2021 1312  ? MONOABS 0.4 12/19/2021 1312  ? EOSABS 0.1 12/19/2021 1312  ? BASOSABS 0.0 12/19/2021 1312  ? ? ?OK to proceed with surgery.  ? ?

## 2021-12-24 NOTE — H&P (Signed)
Office Visit ?11/29/2021 ?[facility] ?Nunzio Cobbs, MD ?Obstetrics and Gynecology Endometrial mass +2 more ?Dx Procedure  ?Reason for Visit  ? ?Additional Documentation ? ?Vitals:  BP 100/64 ?Pulse 84 ?Ht 5' 3.75" (1.619 m) ?Wt 54.4 kg ?SpO2 99% ?BMI 20.76 kg/m? ?BSA 1.56 m? ? ?More Vitals  ?Flowsheets:  Anthropometrics, ?NEWS, ?MEWS Score ?  ?Encounter Info:  Billing Info, ?History, ?Allergies, ?Detailed Report ?  ? ?All Notes ? ? ? Progress Notes by Nunzio Cobbs, MD at 11/29/2021 9:30 AM ? ?Author: Nunzio Cobbs, MD Author Type: Physician Filed: 11/30/2021  8:15 AM  ?Note Status: Signed Cosign: Cosign Not Required Encounter Date: 11/29/2021  ?Editor: Nunzio Cobbs, MD (Physician)      ?Prior Versions: 1. Lowella Fairy, CMA (Certified Medical Assistant) at 11/29/2021  9:52 AM - Sign when Signing Visit  ? 2. Nunzio Cobbs, MD (Physician) at 11/29/2021  9:36 AM - Sign when Signing Visit  ? 3. Lowella Fairy, CMA (Certified Medical Assistant) at 11/28/2021 10:40 AM - Sign when Signing Visit  ?  ?GYNECOLOGY  VISIT ?  ?HPI: ?26 y.o.   Single  African American  female   ?[id number] with No LMP recorded. (Menstrual status: Irregular Periods).   ?here for Sonohysterogram.    ?Pelvic US 4/11/ 23 suggested submucous fibroid. ?She has had irregular menstrual cycles and anemia. ?She takes birth control pills.  ?  ?She has a history of a hysteroscopic polypectomy in May, 2022.  ?No fibroid was encountered.  ?  ?UPT negative.    ?  ?GYNECOLOGIC HISTORY: ?No LMP recorded. (Menstrual status: Irregular Periods). ?Contraception:  OCPs-Yaz ?Menopausal hormone therapy:  n/a ?Last mammogram:  n/a ?Last pap smear:    11-01-20 Neg:Neg HR HPv ?       ?OB History   ?  ?  Gravida  ?0  ? Para  ?0  ? Term  ?0  ? Preterm  ?0  ? AB  ?0  ? Living  ?0  ?  ?  ?  SAB  ?0  ? IAB  ?0  ? Ectopic  ?0  ? Multiple  ?0  ? Live Births  ?0  ?    ?  ?   ?    ?  ?    ?Patient Active  Problem List  ?  Diagnosis Date Noted  ? Thickened endometrium    ? Refugee health examination 02/09/2013  ? Dysmenorrhea 02/09/2013  ? Pediatric body mass index (BMI) of 5th percentile to less than 85th percentile for age 43/24/2014  ?  ?  ?    ?Past Medical History:  ?Diagnosis Date  ? Abnormal uterine bleeding (AUB) 10/16/2020  ? Anemia    ? Fibroid    ? Medical history non-contributory    ?  ?  ?     ?Past Surgical History:  ?Procedure Laterality Date  ? HYSTEROSCOPY WITH D & C N/A 12/20/2020  ?  Procedure: DILATATION AND CURETTAGE /HYSTEROSCOPY with polyp removal;  Surgeon: Chancy Milroy, MD;  Location: [facility];  Service: Gynecology;  Laterality: N/A;  ? NO PAST SURGERIES      ?  ?  ?      ?Current Outpatient Medications  ?Medication Sig Dispense Refill  ? drospirenone-ethinyl estradiol (YASMIN 28) 3-0.03 MG tablet Take 1 tablet by mouth daily. 84 tablet 1  ? ferrous sulfate (FERROUSUL) 325 (65 FE)  MG tablet Take 1 tablet (325 mg total) by mouth 2 (two) times daily. 60 tablet 1  ? ibuprofen (ADVIL) 800 MG tablet Take 1 tablet (800 mg total) by mouth every 8 (eight) hours as needed. 30 tablet 0  ?  ?No current facility-administered medications for this visit.  ?  ?  ?ALLERGIES: Patient has no known allergies. ?  ?     ?Family History  ?Problem Relation Age of Onset  ? Heart disease Brother    ?  ?  ?Social History  ?  ?     ?Socioeconomic History  ? Marital status: Single  ?    Spouse name: Not on file  ? Number of children: Not on file  ? Years of education: Not on file  ? Highest education level: Not on file  ?Occupational History  ? Not on file  ?Tobacco Use  ? Smoking status: Never  ? Smokeless tobacco: Never  ?Vaping Use  ? Vaping Use: Never used  ?Substance and Sexual Activity  ? Alcohol use: Yes  ?    Comment: occasional  ? Drug use: No  ? Sexual activity: Not Currently  ?    Partners: Male  ?    Birth control/protection: Abstinence, None  ?Other Topics Concern  ? Not on file  ?Social  History Narrative  ? Not on file  ?  ?Social Determinants of Health  ?  ?Financial Resource Strain: Not on file  ?Food Insecurity: Not on file  ?Transportation Needs: Not on file  ?Physical Activity: Not on file  ?Stress: Not on file  ?Social Connections: Not on file  ?Intimate Partner Violence: Not on file  ?  ?  ?Review of Systems  ?All other systems reviewed and are negative. ?  ?PHYSICAL EXAMINATION:   ?  ?BP 100/64   Pulse 84   Ht 5' 3.75" (1.619 m)   Wt 120 lb (54.4 kg)   SpO2 99%   BMI 20.76 kg/m?     ?General appearance: alert, cooperative and appears stated age ?Lungs: clear to auscultation bilaterally ?Heart: regular rate and rhythm ?  ?Sonohysterogram ?Consent for procedure.  ?Sterile prep with betadine.   ?Cannula placed and sterile saline injected.  ?3.3 x 2.3 cm intracavitary mass noted, suggestive of submucous fibroid.  ?  ?ASSESSMENT ?  ?Abnormal uterine bleeding on COCs. ?Endometrial mass, Suggestive of submucous fibroid.  ?Status post hysteroscopic polypectomy.  ?Anemia.  ?  ?PLAN ?  ?Discussion of hysteroscopy with Myosure removal of endometrial mass, dilation and curettage.  ?Risks, benefits, and alternatives reviewed. ?Risks include but are not limited to bleeding, infection, damage to surrounding organs including uterine perforation requiring hospitalization and laparoscopy, pulmonary edema, reaction to anesthesia, DVT, PE, death, need for further treatment and surgery including repeat hysteroscopy.  She understands that due to the size of the fibroid, she may need a multistage hysteroscopy.  ?Procedure explained.  ?Patient wishes to proceed. ?  ?Patient will take her iron twice daily.  ?  ?She will proceed forward with her hematology consultation.  ?  ?Ok to continue with COCs. ?  ?An After Visit Summary was printed and given to the patient. ?  ?35 min  total time was spent for this patient encounter, including preparation, face-to-face counseling with the patient, coordination of care,  and documentation of the encounter. ?  ?  ?  ?  ? ? ?

## 2021-12-24 NOTE — Discharge Instructions (Addendum)
Hi Gina Burke,  ? ?I was able to remove the fibroid in your uterus.   ? ?Everything went well today.  ? ?I recommend you do not go to school or work today or tomorrow.  ? ?I will see you for your post op visit.  ? ?[name], MD ? ? ?Post Anesthesia Home Care Instructions ? ?Activity: ?Get plenty of rest for the remainder of the day. A responsible individual must stay with you for 24 hours following the procedure.  ?For the next 24 hours, DO NOT: ?-Drive a car ?-Paediatric nurse ?-Drink alcoholic beverages ?-Take any medication unless instructed by your physician ?-Make any legal decisions or sign important papers. ? ?Meals: ?Start with liquid foods such as gelatin or soup. Progress to regular foods as tolerated. Avoid greasy, spicy, heavy foods. If nausea and/or vomiting occur, drink only clear liquids until the nausea and/or vomiting subsides. Call your physician if vomiting continues. ? ?Special Instructions/Symptoms: ?Your throat may feel dry or sore from the anesthesia or the breathing tube placed in your throat during surgery. If this causes discomfort, gargle with warm salt water. The discomfort should disappear within 24 hours. ? ?DISCHARGE INSTRUCTIONS: HYSTEROSCOPY / ENDOMETRIAL ABLATION ?The following instructions have been prepared to help you care for yourself upon your return home. ? ?May take Tylenol beginning at 12:30 PM as needed for pain/cramping. ? ?May take stool softner while taking narcotic pain medication to prevent constipation.  Drink plenty of water. ? ?Personal hygiene: ? Use sanitary pads for vaginal drainage, not tampons. ? Shower the day after your procedure. ? NO tub baths, pools or Jacuzzis for 2-3 weeks. ? Wipe front to back after using the bathroom. ? ?Activity and limitations: ? Do NOT drive or operate any equipment for 24 hours. The effects of anesthesia are still present ?and drowsiness may result. ? Do NOT rest in bed all day. ? Walking is encouraged. ? Walk up and down stairs  slowly. ? You may resume your normal activity in one to two days or as indicated by your physician. ?Sexual activity: NO intercourse for at least 4 weeks after the procedure, or as indicated by your ?Doctor. Nothing in your vagina. ? ?Diet: Eat a light meal as desired this evening. You may resume your usual diet tomorrow. ? ?Return to Work: You may resume your work activities in one to two days or as indicated by your ?Doctor. ? ?What to expect after your surgery: Expect to have vaginal bleeding/discharge for 2-3 days and ?spotting for up to 10 days. It is not unusual to have soreness for up to 1-2 weeks. You may have a ?slight burning sensation when you urinate for the first day. Mild cramps may continue for a couple of ?days. You may have a regular period in 2-6 weeks. ? ?Call your doctor for any of the following: ? Excessive vaginal bleeding or clotting, saturating and changing one pad every hour. ? Inability to urinate 6 hours after discharge from hospital. ? Pain not relieved by pain medication. ? Fever of 100.4? F or greater. ? Unusual vaginal discharge or odor. ? ? ?    ? ?

## 2021-12-24 NOTE — Op Note (Signed)
OPERATIVE REPORT ? ?PREOPERATIVE DIAGNOSES:   Abnormal uterine bleeding, endometrial mass  ? ?POSTOPERATIVE DIAGNOSES:   Submucous fibroid ? ?PROCEDURE:  Hysteroscopy with Myosure resection of submucous fibroid, dilation and curettage. ? ?SURGEON:  Lenard Galloway, MD ? ?ANESTHESIA:  LMA, paracervical block with 10 mL of 1% lidocaine. ? ?IV FLUIDS:  800 cc LR ? ?EBL:   20 cc ? ?URINE OUTPUT:   100 cc ? ?NORMAL SALINE DEFICIT:    4944 cc NS ? ?COMPLICATIONS:  None. ? ?INDICATIONS FOR THE PROCEDURE:    ? ?The patient is a 26 year old G21P0 African American female who presents with abnormal uterine bleeding and pelvic ultrasound and confirmatory sonohysterogram showing an endometrial mass 3.3 x 2.3 cm, suggestive of a submucous fibroid.  ?  ?A plan is now made to proceed with a hysteroscopy with dilation and curettage and Myosure resection of endometrial mass, after risks, benefits and alternatives were reviewed. ? ?FINDINGS:  Exam under anesthesia revealed a small anteverted, mobile uterus.  No adnexal masses were noted.  ?The uterus was sounded to almost 7 cm.  ?Hysteroscopy showed a 3 cm right posterior right fundal submucous fibroid.  The right tubal ostia could not be visualized, and the left tubal ostia appeared to be normal.  ?Endometrial currettings were scant.   ? ?SPECIMENS:   The submucous fibroid and the endometrial curettings were sent to pathology separately.  ? ?PROCEDURE IN DETAIL:  The patient was reidentified in the preoperative ?hold area.  She received TED hose and PAS stockings for DVT prophylaxis. ? ?In the operating room, the patient was placed in the dorsal lithotomy ?position and then an LMA anesthetic was introduced.  The patient's lower ?abdomen, vagina and perineum were sterilely prepped with Betadine and the  ?patient's bladder was catheterized of urine.  She was sterilly draped. ? ?An exam under anesthesia was performed. ? ?A speculum was placed inside the vagina and a single-tooth tenaculum  was placed on the anterior cervical lip.  The cervix with injected with a total of 20 cc of a mixture of vasopressin 20 units in 200 cc of normal saline.  The cervix was injected at the 2:00, 5:00, 7:00, and 10:00 positions. The uterus was sounded. The cervix was dilated to a #19 Pratt dilator.  The MyoSure hysteroscope ?was then inserted inside the uterine cavity under the continuous infusion of normal saline solution.   ?Findings are as noted above.  An additional tenaculum was placed on the posterior cervical lip.  The Myosure Excel device was used to resect the submucous fibroid without difficulty. The MyoSure hysteroscope was removed after the fibroid was resected, and the specimen was sent to Pathology.  The posterior tenaculum was removed.  The cervix was injected with another 10 cc of the vasopressin mixture, with ratios and locations as noted above.  ?  ?The serrated curette was introduced into the uterine cavity and the endometrium was curetted in all 4 quadrants.   A minimal amount of endometrial curettings was obtained.  This specimen was sent to Pathology separately.  ? ?A paracervical block was performed with a total of 10 mL of 1% lidocaine plain. ?The single-tooth tenaculum which had been placed on the anterior cervical lip was removed.     ?Hemostasis was good, and all of the vaginal instruments were removed. ? ?The patient was awakened and escorted to the recovery room in stable ?condition after she was cleansed of Betadine.  There were no complications to the procedure.   ?  All needle, instrument and sponge counts were correct. ? ?Lenard Galloway, MD ? ? ?

## 2021-12-24 NOTE — Anesthesia Procedure Notes (Signed)
Procedure Name: LMA Insertion ?Date/Time: 12/24/2021 7:32 AM ?Performed by: Rogers Blocker, CRNA ?Pre-anesthesia Checklist: Patient identified, Emergency Drugs available, Suction available and Patient being monitored ?Patient Re-evaluated:Patient Re-evaluated prior to induction ?Oxygen Delivery Method: Circle System Utilized ?Preoxygenation: Pre-oxygenation with 100% oxygen ?Induction Type: IV induction ?Ventilation: Mask ventilation without difficulty ?LMA: LMA inserted ?LMA Size: 4.0 ?Number of attempts: 1 ?Placement Confirmation: positive ETCO2 ?Tube secured with: Tape ?Dental Injury: Teeth and Oropharynx as per pre-operative assessment  ? ? ? ? ?

## 2021-12-24 NOTE — Transfer of Care (Signed)
Immediate Anesthesia Transfer of Care Note ? ?Patient: Gina Burke ? ?Procedure(s) Performed: Hysteroscopic Myomectomy with Myosure/ Dilation and Curettage ? ?Patient Location: PACU ? ?Anesthesia Type:General ? ?Level of Consciousness: awake, alert , oriented and patient cooperative ? ?Airway & Oxygen Therapy: Patient Spontanous Breathing ? ?Post-op Assessment: Report given to RN and Post -op Vital signs reviewed and stable ? ?Post vital signs: Reviewed and stable ? ?Last Vitals:  ?Vitals Value Taken Time  ?BP 113/76 12/24/21 0900  ?Temp 36.4 ?C 12/24/21 0900  ?Pulse 95 12/24/21 0901  ?Resp 14 12/24/21 0901  ?SpO2 100 % 12/24/21 0901  ?Vitals shown include unvalidated device data. ? ?Last Pain:  ?Vitals:  ? 12/24/21 0605  ?TempSrc: Oral  ?PainSc: 0-No pain  ?   ? ?Patients Stated Pain Goal: 8 (12/24/21 8811) ? ?Complications: No notable events documented. ?

## 2021-12-25 ENCOUNTER — Encounter (HOSPITAL_BASED_OUTPATIENT_CLINIC_OR_DEPARTMENT_OTHER): Payer: Self-pay | Admitting: Obstetrics and Gynecology

## 2021-12-25 LAB — SURGICAL PATHOLOGY

## 2022-01-07 ENCOUNTER — Encounter: Payer: Self-pay | Admitting: Obstetrics and Gynecology

## 2022-01-07 ENCOUNTER — Ambulatory Visit (INDEPENDENT_AMBULATORY_CARE_PROVIDER_SITE_OTHER): Payer: 59 | Admitting: Obstetrics and Gynecology

## 2022-01-07 VITALS — BP 110/68 | HR 105 | Ht 63.75 in | Wt 120.0 lb

## 2022-01-07 DIAGNOSIS — Z9889 Other specified postprocedural states: Secondary | ICD-10-CM

## 2022-01-07 NOTE — Progress Notes (Unsigned)
GYNECOLOGY  VISIT   HPI: 26 y.o.   Single  African American  female   G0P0000 with No LMP recorded. (Menstrual status: Irregular Periods).   here for 2 weeks status post  Hysteroscopic Myomectomy with Myosure/ Dilation and Curettage.  Bleeding stopped.   She does not like her OCPs.  Does not need birth control.   GYNECOLOGIC HISTORY: No LMP recorded. (Menstrual status: Irregular Periods). Contraception:  OCPs--ran out Menopausal hormone therapy:  n/a Last mammogram:  n/a Last pap smear:   11-01-20 Neg:Neg HR HPV        OB History     Gravida  0   Para  0   Term  0   Preterm  0   AB  0   Living  0      SAB  0   IAB  0   Ectopic  0   Multiple  0   Live Births  0              Patient Active Problem List   Diagnosis Date Noted   Thickened endometrium    Refugee health examination 02/09/2013   Dysmenorrhea 02/09/2013   Pediatric body mass index (BMI) of 5th percentile to less than 85th percentile for age 62/24/2014    Past Medical History:  Diagnosis Date   Abnormal uterine bleeding (AUB) 10/16/2020   Anemia    Fibroid     Past Surgical History:  Procedure Laterality Date   HYSTEROSCOPY N/A 12/24/2021   Procedure: Hysteroscopic Myomectomy with Myosure/ Dilation and Curettage;  Surgeon: Nunzio Cobbs, MD;  Location: [facility];  Service: Gynecology;  Laterality: N/A;   HYSTEROSCOPY WITH D & C N/A 12/20/2020   Procedure: DILATATION AND CURETTAGE /HYSTEROSCOPY with polyp removal;  Surgeon: Chancy Milroy, MD;  Location: [facility];  Service: Gynecology;  Laterality: N/A;   NO PAST SURGERIES      Current Outpatient Medications  Medication Sig Dispense Refill   drospirenone-ethinyl estradiol (YASMIN 28) 3-0.03 MG tablet Take 1 tablet by mouth daily. 84 tablet 1   ferrous sulfate 325 (65 FE) MG EC tablet Take 1 tablet (325 mg total) by mouth 3 (three) times daily with meals. 90 tablet 3   senna-docusate  (SENOKOT-S) 8.6-50 MG tablet Take 1 tablet by mouth 2 (two) times daily. 60 tablet 0   No current facility-administered medications for this visit.     ALLERGIES: Patient has no known allergies.  Family History  Problem Relation Age of Onset   Heart disease Brother     Social History   Socioeconomic History   Marital status: Single    Spouse name: Not on file   Number of children: Not on file   Years of education: Not on file   Highest education level: Not on file  Occupational History   Not on file  Tobacco Use   Smoking status: Never   Smokeless tobacco: Never  Vaping Use   Vaping Use: Never used  Substance and Sexual Activity   Alcohol use: Yes    Comment: occasional   Drug use: No   Sexual activity: Not Currently    Partners: Male    Birth control/protection: Abstinence, None  Other Topics Concern   Not on file  Social History Narrative   Not on file   Social Determinants of Health   Financial Resource Strain: Not on file  Food Insecurity: Not on file  Transportation Needs: Not on file  Physical Activity: Not on file  Stress: Not on file  Social Connections: Not on file  Intimate Partner Violence: Not on file    Review of Systems  All other systems reviewed and are negative.  PHYSICAL EXAMINATION:    BP 110/68   Pulse (!) 105   Ht 5' 3.75" (1.619 m)   Wt 120 lb (54.4 kg)   SpO2 99%   BMI 20.76 kg/m     General appearance: alert, cooperative and appears stated age Head: Normocephalic, without obvious abnormality, atraumatic Neck: no adenopathy, supple, symmetrical, trachea midline and thyroid normal to inspection and palpation Lungs: clear to auscultation bilaterally Breasts: normal appearance, no masses or tenderness, No nipple retraction or dimpling, No nipple discharge or bleeding, No axillary or supraclavicular adenopathy Heart: regular rate and rhythm Abdomen: soft, non-tender, no masses,  no organomegaly Extremities: extremities normal,  atraumatic, no cyanosis or edema Skin: Skin color, texture, turgor normal. No rashes or lesions Lymph nodes: Cervical, supraclavicular, and axillary nodes normal. No abnormal inguinal nodes palpated Neurologic: Grossly normal  Pelvic: External genitalia:  no lesions              Urethra:  normal appearing urethra with no masses, tenderness or lesions              Bartholins and Skenes: normal                 Vagina: normal appearing vagina with normal color and discharge, no lesions              Cervix: no lesions                Bimanual Exam:  Uterus:  normal size, contour, position, consistency, mobility, non-tender              Adnexa: no mass, fullness, tenderness              Rectal exam: {yes no:[id number]}.  Confirms.              Anus:  normal sphincter tone, no lesions  Chaperone was present for exam:  ***  ASSESSMENT     PLAN     An After Visit Summary was printed and given to the patient.  ______ minutes face to face time of which over 50% was spent in counseling.

## 2022-01-07 NOTE — Patient Instructions (Signed)
Hysterosalpingography  Hysterosalpingography is a procedure to look inside a woman's womb (uterus) and fallopian tubes. During this procedure, contrast dye is injected into the uterus through the vagina and cervix. X-rays are then taken. The dye makes the uterus and fallopian tubes show up clearly on the X-rays and may show whether the tubes are blocked, scarred, or if there are any other abnormalities. This procedure may be done: To determine if there are tumors, scars, or other abnormalities in the uterus. To determine why a woman is unable to get pregnant or has had repeated pregnancy losses. To make sure the fallopian tubes are completely blocked a few months after having certain tubal sterilization procedures. Tell a health care provider about: Any allergies you have. All medicines you are taking, including vitamins, herbs, eye drops, creams, and over-the-counter medicines. Any problems you or family members have had with anesthetic medicines. Any blood disorders you have. Any surgeries you have had. Any medical conditions you have. Whether you are pregnant or may be pregnant. Whether you have been diagnosed with an STI (sexually transmitted infection) or you think you have an STI. What are the risks? Generally, this is a safe procedure. However, problems may occur, including: Infection in the lining of the uterus or fallopian tubes. Allergic reaction to medicines or dyes. A hole (perforation) in the uterus or fallopian tubes. Damage to other structures or organs. What happens before the procedure? Medicines Ask your health care provider about: Changing or stopping your regular medicines. This is especially important if you are taking diabetes medicines or blood thinners. Taking medicines such as aspirin and ibuprofen. These medicines can thin your blood. Do not take these medicines unless your health care provider tells you to take them. Taking over-the-counter medicines, vitamins,  herbs, and supplements. General instructions Schedule the procedure after your menstrual period stops, but before your next ovulation. This is usually between day 5 and day 10 of your last period. Day 1 is the first day of your period. Plan to have a responsible adult take you home from the hospital or clinic. Plan to have a responsible adult care for you for the time you are told after you leave the hospital or clinic. This is important. Empty your bladder before the procedure begins. What happens during the procedure? You may be given: A medicine to help you relax (sedative). A medicine to numb the area (local anesthetic). An over-the-counter pain medicine. You will lie down on your back and place your feet into footrests (stirrups). A device called a speculum will be inserted into your vagina. This allows your health care provider to see inside your vagina through to your cervix. Your cervix will be washed with a germ-killing soap. A medicine may be injected into your cervix to numb it. A thin, flexible tube will be passed through your cervix into your uterus. Contrast dye will be passed through the tube and into the uterus. This may cause cramping. Several X-rays will be taken as the contrast dye spreads through the uterus and into the fallopian tubes. You may be asked to your change position and roll from side to side if needed. The tube will be removed. The contrast dye will flow out through your vagina. The procedure may vary among health care providers and hospitals. What can I expect after procedure? You may have mild cramping and vaginal bleeding. This should go away after a short time. Most of the contrast dye will flow out of your vagina. This fluid will  be sticky and may have blood in it. You may want to wear a sanitary pad. Do not use a tampon. You have mild dizziness or nausea. Follow these instructions at home: Do not douche, use tampons, or have sex until your health care  provider approves. Do not take baths, swim, or use a hot tub until your health care provider approves. Take showers instead of baths for 2 weeks, or for as long as told by your health care provider. If you were given a sedative during the procedure, it can affect you for several hours. Do not drive or operate machinery until your health care provider says that it is safe. Take over-the-counter and prescription medicines only as told by your health care provider. It is up to you to get the results of your procedure. Ask your health care provider, or the department that is doing the procedure, when your results will be ready. Keep all follow-up visits. This is important. Contact a health care provider if: You have a fever or chills. You faint. You have severe cramping. Your skin has a rash, and is itchy or swollen. You have a bad-smelling vaginal discharge. You have vaginal bleeding that lasts for more than 4 days. Get help right away if: You have nausea and vomiting. You have heavy vaginal bleeding that soaks more than one pad every hour. You have severe abdominal pain. Summary Hysterosalpingography is a procedure in which a woman's uterus and fallopian tubes are examined. During this procedure, contrast dye is injected into the uterus through the vagina and cervix. X-rays are then taken. The dye helps the uterus and fallopian tubes show up clearly on the X-rays. Schedule the procedure after your menstrual period stops, but before your next ovulation. This is usually between day 5 and day 10 of your last period. After the procedure, you may have mild cramping and vaginal bleeding. This should go away after a short time. Do not douche, use tampons, or have sex until your health care provider approves. This information is not intended to replace advice given to you by your health care provider. Make sure you discuss any questions you have with your health care provider. Document Revised:  04/18/2021 Document Reviewed: 03/22/2020 Elsevier Patient Education  [employer].

## 2022-01-08 ENCOUNTER — Telehealth: Payer: Self-pay | Admitting: *Deleted

## 2022-01-08 NOTE — Telephone Encounter (Signed)
Patient left message on surgery line requesting return call regarding f/u appt.    Left message to call Sharee Pimple, RN at [facility], [phone].

## 2022-01-11 ENCOUNTER — Inpatient Hospital Stay: Payer: 59

## 2022-01-11 ENCOUNTER — Inpatient Hospital Stay: Payer: 59 | Admitting: Oncology

## 2022-01-18 NOTE — Telephone Encounter (Signed)
Left message to call Saphire Barnhart, RN at [facility], [phone].  

## 2022-01-28 NOTE — Telephone Encounter (Signed)
No return call from patient.   Routing to Dr. Antony Blackbird.   Encounter closed.

## 2022-02-11 ENCOUNTER — Inpatient Hospital Stay: Payer: 59 | Admitting: Oncology

## 2022-02-11 ENCOUNTER — Inpatient Hospital Stay: Payer: 59

## 2022-03-01 ENCOUNTER — Inpatient Hospital Stay: Payer: 59 | Attending: Nurse Practitioner

## 2022-03-01 ENCOUNTER — Encounter: Payer: Self-pay | Admitting: *Deleted

## 2022-03-01 ENCOUNTER — Inpatient Hospital Stay: Payer: 59 | Admitting: Oncology

## 2022-03-01 NOTE — Progress Notes (Signed)
"  No show" for lab/OV today. Scheduling message sent to reschedule for next few weeks.

## 2022-03-18 ENCOUNTER — Encounter: Payer: Self-pay | Admitting: Obstetrics and Gynecology

## 2022-03-18 DIAGNOSIS — Z9889 Other specified postprocedural states: Secondary | ICD-10-CM

## 2022-03-19 NOTE — Telephone Encounter (Signed)
Appointment scheduled for 03/26/22 with Dr. Quincy Simmonds.

## 2022-03-25 NOTE — Progress Notes (Unsigned)
GYNECOLOGY  VISIT   HPI: 26 y.o.   Single  African American  female   [place] with Patient's last menstrual period was 03/16/2022 (exact date).   here for menstrual problem. Patient states since surgery in May, her cycles have been light and short. It only lasts 1-2 days. Has mild cramps and feels tired.  Mostly worried if she is pregnant.  Had a negative pregnancy test at home.  June cycle lasted 3 days.  LMP on 03/16/22 lasted only for day.   Status post hysteroscopy myomectomy 12/24/21.   She stopped her birth control right after her surgery was done.  She was on the pills since May, 2022.  Her periods prior to this were heavy and painful due to a submucous fibroid.  No nipple discharge.  No increased hair on face of chest.  Having some headaches.  Some PMS symptoms.  Some loss of peripheral when driving.   Not trying for pregnancy.  Not preventing pregnancy prevention.  Would use condoms.  Has used Plan B in the past.   When she took birth control pills, she did not feel like herself.  Had rapid heart beat and had anxiety.   She would like to have evaluation for potential blockage of fallopian tube, noted at the time of her hysteroscopy.  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   UPT:  negative.   GYNECOLOGIC HISTORY: Patient's last menstrual period was 03/16/2022 (exact date). Contraception:  None Menopausal hormone therapy:  n/a Last mammogram:  n/a Last pap smear:   11-01-20 Neg:Neg HR HPV        OB History     Gravida  0   Para  0   Term  0   Preterm  0   AB  0   Living  0      SAB  0   IAB  0   Ectopic  0   Multiple  0   Live Births  0              Patient Active Problem List   Diagnosis Date Noted   Thickened endometrium    Refugee health examination 02/09/2013   Dysmenorrhea 02/09/2013   Pediatric body mass index (BMI) of 5th percentile to less than 85th percentile for age 61/24/2014    Past Medical History:  Diagnosis Date    Abnormal uterine bleeding (AUB) 10/16/2020   Anemia    Fibroid     Past Surgical History:  Procedure Laterality Date   HYSTEROSCOPY N/A 12/24/2021   Procedure: Hysteroscopic Myomectomy with Myosure/ Dilation and Curettage;  Surgeon: Nunzio Cobbs, MD;  Location: [facility];  Service: Gynecology;  Laterality: N/A;   HYSTEROSCOPY WITH D & C N/A 12/20/2020   Procedure: DILATATION AND CURETTAGE /HYSTEROSCOPY with polyp removal;  Surgeon: Chancy Milroy, MD;  Location: [facility];  Service: Gynecology;  Laterality: N/A;   NO PAST SURGERIES      Current Outpatient Medications  Medication Sig Dispense Refill   ferrous sulfate 325 (65 FE) MG EC tablet Take 1 tablet (325 mg total) by mouth 3 (three) times daily with meals. (Patient not taking: Reported on 03/26/2022) 90 tablet 3   No current facility-administered medications for this visit.     ALLERGIES: Patient has no known allergies.  Family History  Problem Relation Age of Onset   Heart disease Brother     Social History   Socioeconomic History   Marital status:  Single    Spouse name: Not on file   Number of children: Not on file   Years of education: Not on file   Highest education level: Not on file  Occupational History   Not on file  Tobacco Use   Smoking status: Never   Smokeless tobacco: Never  Vaping Use   Vaping Use: Never used  Substance and Sexual Activity   Alcohol use: Yes    Comment: occasional   Drug use: No   Sexual activity: Not Currently    Partners: Male    Birth control/protection: Abstinence, None  Other Topics Concern   Not on file  Social History Narrative   Not on file   Social Determinants of Health   Financial Resource Strain: Not on file  Food Insecurity: Not on file  Transportation Needs: Not on file  Physical Activity: Not on file  Stress: Not on file  Social Connections: Not on file  Intimate Partner Violence: Not on file    Review of  Systems  Genitourinary:  Positive for menstrual problem (cycles last only 1-2 days since surgery).  All other systems reviewed and are negative.   PHYSICAL EXAMINATION:    BP 100/70   Ht 5' 3.75" (1.619 m)   Wt 128 lb (58.1 kg)   LMP 03/16/2022 (Exact Date)   BMI 22.14 kg/m     General appearance: alert, cooperative and appears stated age   Pelvic: External genitalia:  no lesions              Urethra:  normal appearing urethra with no masses, tenderness or lesions              Bartholins and Skenes: normal                 Vagina: normal appearing vagina with normal color and discharge, no lesions              Cervix: no lesions                Bimanual Exam:  Uterus:  normal size, contour, position, consistency, mobility, non-tender              Adnexa: no mass, fullness, tenderness  Chaperone was present for exam:  Estill Bamberg, CMA  ASSESSMENT  Abnormal uterine bleeding.  Light menstrual cycles off COCs.  Status post myomectomy.  Desire for future pregnancy, but not immediately.   PLAN  Multiple etiologies of light menstrual periods discussed.  Will check FSH, LH, estradiol, TSH, prolactin.  Will order hysterosalpingogram.  We briefly discussed IUDs for pregnancy prevention as a way to be hormone free or use low level hormonal exposure to prevent pregnancy.  Fu prn.    An After Visit Summary was printed and given to the patient.  26 min  total time was spent for this patient encounter, including preparation, face-to-face counseling with the patient, coordination of care, and documentation of the encounter.

## 2022-03-26 ENCOUNTER — Ambulatory Visit: Payer: 59 | Admitting: Obstetrics and Gynecology

## 2022-03-26 ENCOUNTER — Telehealth: Payer: Self-pay | Admitting: Obstetrics and Gynecology

## 2022-03-26 ENCOUNTER — Encounter: Payer: Self-pay | Admitting: Obstetrics and Gynecology

## 2022-03-26 VITALS — BP 100/70 | Ht 63.75 in | Wt 128.0 lb

## 2022-03-26 DIAGNOSIS — Z9889 Other specified postprocedural states: Secondary | ICD-10-CM | POA: Diagnosis not present

## 2022-03-26 DIAGNOSIS — N939 Abnormal uterine and vaginal bleeding, unspecified: Secondary | ICD-10-CM

## 2022-03-26 DIAGNOSIS — Z319 Encounter for procreative management, unspecified: Secondary | ICD-10-CM

## 2022-03-26 LAB — PREGNANCY, URINE: Preg Test, Ur: NEGATIVE

## 2022-03-26 NOTE — Telephone Encounter (Signed)
Advised patient to call on day 1 of cycle.

## 2022-03-26 NOTE — Telephone Encounter (Signed)
Please order hysterosalpingogram at [facility] for my patient.  She has had a hysteroscopic myomectomy and had potential occlusion of her right fallopian tube noted at the time of the procedure.  She desires future fertility.   No prophylactic antibiotic needed for the procedure.

## 2022-03-27 LAB — TSH: TSH: 1.08 mIU/L

## 2022-03-27 LAB — FSH/LH
FSH: 8.1 m[IU]/mL
LH: 13.5 m[IU]/mL

## 2022-03-27 LAB — ESTRADIOL: Estradiol: 215 pg/mL

## 2022-03-27 LAB — PROLACTIN: Prolactin: 10.5 ng/mL

## 2022-04-15 NOTE — Telephone Encounter (Signed)
Patient sent my chart message regarding this see patient advice request on 03/18/22

## 2022-04-15 NOTE — Telephone Encounter (Signed)
Patient left message on surgery line on 8/25 at 3:30 pm requesting to schedule HSG.   Routing to [facility] triage to assist with scheduling.

## 2022-04-25 ENCOUNTER — Ambulatory Visit: Payer: 59 | Admitting: Oncology

## 2022-04-25 ENCOUNTER — Inpatient Hospital Stay: Payer: 59

## 2022-05-09 ENCOUNTER — Inpatient Hospital Stay: Payer: 59 | Admitting: Nurse Practitioner

## 2022-05-09 ENCOUNTER — Inpatient Hospital Stay: Payer: 59 | Attending: Nurse Practitioner

## 2022-05-10 NOTE — Telephone Encounter (Signed)
Dr.Silva patient needs HSG however for the last 2 months her cycles have fell on days that are not within the timed scheduled date with [facility].

## 2022-05-14 ENCOUNTER — Telehealth: Payer: Self-pay | Admitting: Obstetrics and Gynecology

## 2022-05-14 ENCOUNTER — Telehealth: Payer: Self-pay | Admitting: Nurse Practitioner

## 2022-05-14 NOTE — Telephone Encounter (Signed)
We could do a sonohysterogram in the office to evaluate her tubal patency and her uterine cavity if scheduling for a hysterosalpingogram is not possible.    A sonohysterogram would also need to be done when there is not a risk of possible pregnancy, meaning right after her period has ended.

## 2022-05-14 NOTE — Telephone Encounter (Signed)
Attempted to contact patient in regards to schedule message to reschedule no show visits. No answer so voicemail was left

## 2022-05-15 NOTE — Telephone Encounter (Signed)
Encounter reviewed and closed.  

## 2022-05-15 NOTE — Telephone Encounter (Signed)
Patient informed. Report she will call [facility] imaging one more time next month and if no appointments she will call our office for [facility].

## 2022-06-10 ENCOUNTER — Telehealth: Payer: Self-pay | Admitting: *Deleted

## 2022-06-10 DIAGNOSIS — Z319 Encounter for procreative management, unspecified: Secondary | ICD-10-CM

## 2022-06-10 DIAGNOSIS — Z9889 Other specified postprocedural states: Secondary | ICD-10-CM

## 2022-06-10 NOTE — Telephone Encounter (Signed)
I called patient and informed her of time and date on 06/17/22 for HSG. When I called patient she reports her cycle did not start on this date. Patient reports her cycle started on 06/07/22. [facility] said with date change SHGM cannot be done.  Per telephone encounter 05/14/22   "We could do a sonohysterogram in the office to evaluate her tubal patency and her uterine cavity if scheduling for a hysterosalpingogram is not possible.     A sonohysterogram would also need to be done when there is not a risk of possible pregnancy, meaning right after her period has ended. "  I spoke with Rosemarie Ax and next [facility] available appointment date with Dr.Silva isn't until 11/22. No slots for this cycle.  Patient has tried several times to schedule with [facility] imaging unable to do so due to availability.

## 2022-06-10 NOTE — Telephone Encounter (Signed)
Patient left voicemail on surgery line requesting to schedule HSG, menses started 06/10/22.    Routing to [place] [facility] for assistance with scheduling.

## 2022-06-10 NOTE — Telephone Encounter (Signed)
Call placed to [facility] scheduling, was advised no availability for HSG at [facility] or [facility] 10/26-10/29.   Spoke with patient. Confirmed LMP 06/07/22.  Patient reports menses are irregular.   Advised we can schedule SHGM in office for Nov 2023. Patient will contact office to notify on day one of menses. We will then review if [facility] scheduled within appropriate timeframe. Advised patient if menses has not started by 11/23, would need to notify office to reschedule [facility]. Advised we can also contact [facility] and [facility], if needed, to review availability again when menses starts. Patient verbalizes understanding and is agreeable.

## 2022-06-11 NOTE — Telephone Encounter (Signed)
Order placed for [facility], routing to Dr. Quincy Simmonds to review.

## 2022-06-17 ENCOUNTER — Other Ambulatory Visit: Payer: Medicaid Other

## 2022-06-24 NOTE — Telephone Encounter (Signed)
[place] for sonohysterogram in this office.   OK for hysterosalpingogram if done at [place] or the hospital.  The goal is to check for tubal patency.

## 2022-06-24 NOTE — Telephone Encounter (Signed)
Encounter reviewed.   Routing to [employer].   Will close encounter. Patient to follow-up.

## 2022-06-25 NOTE — Telephone Encounter (Signed)
Patient scheduled for [facility] on 07/17/22 at this office.

## 2022-07-15 NOTE — Progress Notes (Signed)
GYNECOLOGY  VISIT   HPI: 26 y.o.   Single  African American  female   [id number] with Patient's last menstrual period was 07/05/2022.   here for sonohysterogram to check for tubal patency.   She had a submucosal fibroid resection earlier this year. Her right proximal tubal ostia appeared potentially occluded at the time of her hysteroscopy surgery.  She is having regular menses with regular flow.   She declines birth control options.   She desires future pregnancy.   GYNECOLOGIC HISTORY: Patient's last menstrual period was 07/05/2022. Contraception:  none Menopausal hormone therapy:  n/a Last mammogram:  n/a Last pap smear:   11/01/20 negative: HR HPV negative        OB History     Gravida  0   Para  0   Term  0   Preterm  0   AB  0   Living  0      SAB  0   IAB  0   Ectopic  0   Multiple  0   Live Births  0              Patient Active Problem List   Diagnosis Date Noted   Thickened endometrium    Refugee health examination 02/09/2013   Dysmenorrhea 02/09/2013   Pediatric body mass index (BMI) of 5th percentile to less than 85th percentile for age 48/24/2014    Past Medical History:  Diagnosis Date   Abnormal uterine bleeding (AUB) 10/16/2020   Anemia    Fibroid     Past Surgical History:  Procedure Laterality Date   HYSTEROSCOPY N/A 12/24/2021   Procedure: Hysteroscopic Myomectomy with Myosure/ Dilation and Curettage;  Surgeon: Patton Salles, MD;  Location: [facility];  Service: Gynecology;  Laterality: N/A;   HYSTEROSCOPY WITH D & C N/A 12/20/2020   Procedure: DILATATION AND CURETTAGE /HYSTEROSCOPY with polyp removal;  Surgeon: Hermina Staggers, MD;  Location: [facility];  Service: Gynecology;  Laterality: N/A;   NO PAST SURGERIES      Current Outpatient Medications  Medication Sig Dispense Refill   ferrous sulfate 325 (65 FE) MG EC tablet Take 1 tablet (325 mg total) by mouth 3 (three) times daily  with meals. (Patient not taking: Reported on 03/26/2022) 90 tablet 3   No current facility-administered medications for this visit.     ALLERGIES: Patient has no known allergies.  Family History  Problem Relation Age of Onset   Heart disease Brother     Social History   Socioeconomic History   Marital status: Single    Spouse name: Not on file   Number of children: Not on file   Years of education: Not on file   Highest education level: Not on file  Occupational History   Not on file  Tobacco Use   Smoking status: Never   Smokeless tobacco: Never  Vaping Use   Vaping Use: Never used  Substance and Sexual Activity   Alcohol use: Yes    Comment: occasional   Drug use: No   Sexual activity: Not Currently    Partners: Male    Birth control/protection: Abstinence, None  Other Topics Concern   Not on file  Social History Narrative   Not on file   Social Determinants of Health   Financial Resource Strain: Not on file  Food Insecurity: Not on file  Transportation Needs: Not on file  Physical Activity: Not on file  Stress: Not  on file  Social Connections: Not on file  Intimate Partner Violence: Not on file    Review of Systems  All other systems reviewed and are negative.   PHYSICAL EXAMINATION:    BP 128/81 (BP Location: Right Arm, Patient Position: Sitting, Cuff Size: Normal)   Ht 5\' 5"  (1.651 m) Comment: w/ shoes  Wt 131 lb (59.4 kg)   LMP 07/05/2022   BMI 21.80 kg/m     General appearance: alert, cooperative and appears stated age   Pelvic US Uterus 7.16 x 3.93 x 2.92 cm.  No myometrial masses. EMS 3.28 mm. Left ovary  3.63 x 2.18 x 1.99 cm.  Right ovary  4.37 x 2.05 x 2.10 cm. No adnexal masses.  Small fluid in the cul de sac.   Sonohysterogram Consent for procedure.  Sterile prep with Hibiclens.  Cannula passed and normal saline injected.  Uterine cavity with no masses.  Unable to demonstrate tubal patency.  No additional fluid in the cul de  sac.   ASSESSMENT  Status post hysteroscopic myomectomy.  No residual fibroid. Unable to determine patency of the fallopian tubes.  Desire for future pregnancy.  Menstrual cramps.  PLAN  Pelvic US images and report reviewed.  Will proceed with hysterosalpingogram.  Procedure and rationale explained.  Fu for annual exam in March, 2024.    An After Visit Summary was printed and given to the patient.  20 min  total time was spent for this patient encounter, including preparation, face-to-face counseling with the patient, coordination of care, and documentation of the encounter.

## 2022-07-17 ENCOUNTER — Encounter: Payer: Self-pay | Admitting: Obstetrics and Gynecology

## 2022-07-17 ENCOUNTER — Ambulatory Visit (INDEPENDENT_AMBULATORY_CARE_PROVIDER_SITE_OTHER): Payer: 59

## 2022-07-17 ENCOUNTER — Other Ambulatory Visit: Payer: Self-pay | Admitting: Obstetrics and Gynecology

## 2022-07-17 ENCOUNTER — Telehealth: Payer: Self-pay | Admitting: Obstetrics and Gynecology

## 2022-07-17 ENCOUNTER — Ambulatory Visit (INDEPENDENT_AMBULATORY_CARE_PROVIDER_SITE_OTHER): Payer: 59 | Admitting: Obstetrics and Gynecology

## 2022-07-17 VITALS — BP 128/81 | Ht 65.0 in | Wt 131.0 lb

## 2022-07-17 DIAGNOSIS — N946 Dysmenorrhea, unspecified: Secondary | ICD-10-CM | POA: Diagnosis not present

## 2022-07-17 DIAGNOSIS — Z9889 Other specified postprocedural states: Secondary | ICD-10-CM | POA: Diagnosis not present

## 2022-07-17 DIAGNOSIS — Z319 Encounter for procreative management, unspecified: Secondary | ICD-10-CM

## 2022-07-17 MED ORDER — IBUPROFEN 200 MG PO TABS
400.0000 mg | ORAL_TABLET | Freq: Once | ORAL | Status: AC
  Administered 2022-07-18: 400 mg via ORAL

## 2022-07-17 MED ORDER — IBUPROFEN 200 MG PO TABS: 200.0000 mg | ORAL_TABLET | Freq: Once | ORAL | Status: DC

## 2022-07-17 NOTE — Telephone Encounter (Signed)
Please precert and schedule a hysterosalpingogram for my patient.   She has a desire for pregnancy and does not have signs of tubal patency with her office sonohysterogram.  She has had a resection of a submucous fibroid, for which she has no residual fibroid noted today.

## 2022-07-18 NOTE — Telephone Encounter (Signed)
Imaging order was already placed back in 03/2022 and is still good until 03/2023.   Pt notified and provided with [facility] phone number to call and schedule.  Pt voiced understanding.

## 2022-08-02 NOTE — Telephone Encounter (Signed)
FYI. Pt has been contacted twice by [place] imaging as well and as of now, has not been scheduled. What do you recommend? Please advise.

## 2022-08-04 NOTE — Telephone Encounter (Signed)
Will let the patient decide when she would like to proceed.  OK that this encounter is now closed.

## 2022-09-14 IMAGING — US US PELVIS COMPLETE WITH TRANSVAGINAL
1 series · 13 of 25 positions shown · non-contrast
Comparison: None

CLINICAL DATA: Heavy menses with continued bleeding since
10/16/2020



[Series 1: us pelvis complete with transvaginal · 0.22mm/px · 80 acquisitions, 13 frames shown]
[im 1/80]
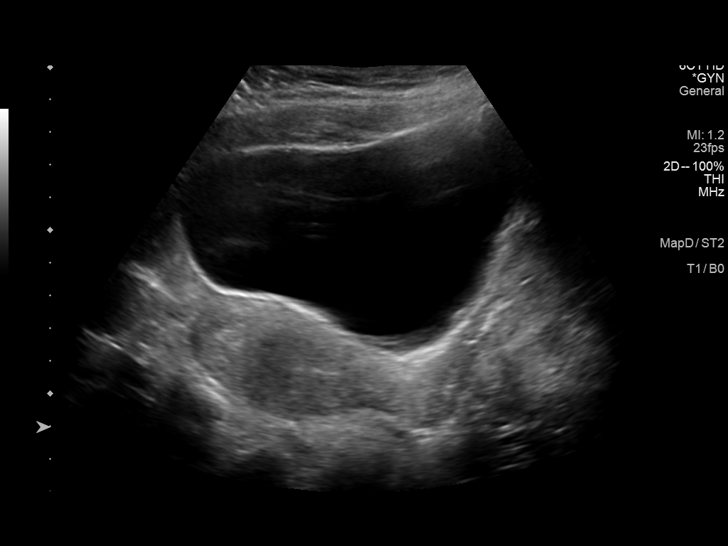
[im 7/80]
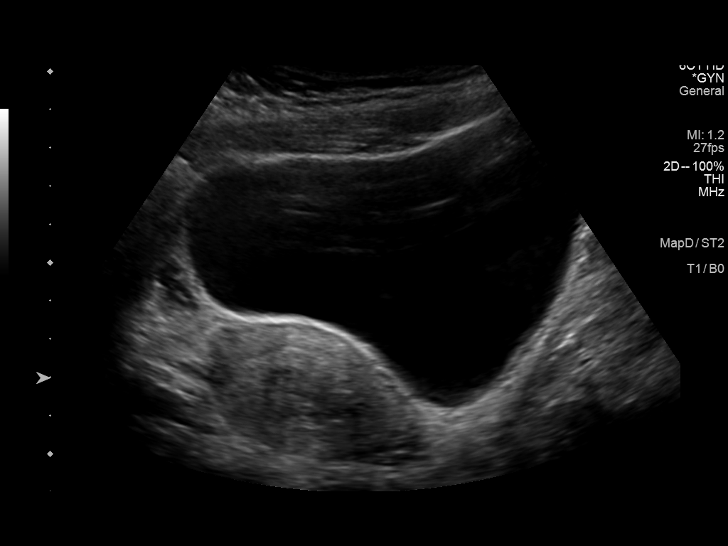
[im 14/80]
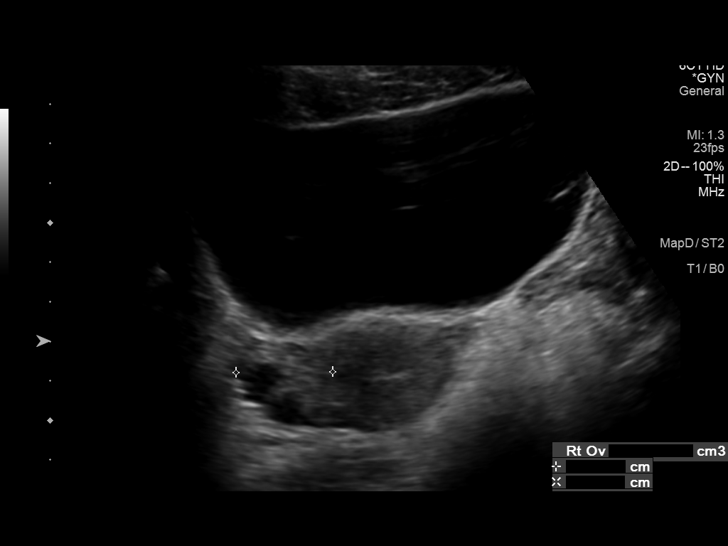
[im 20/80]
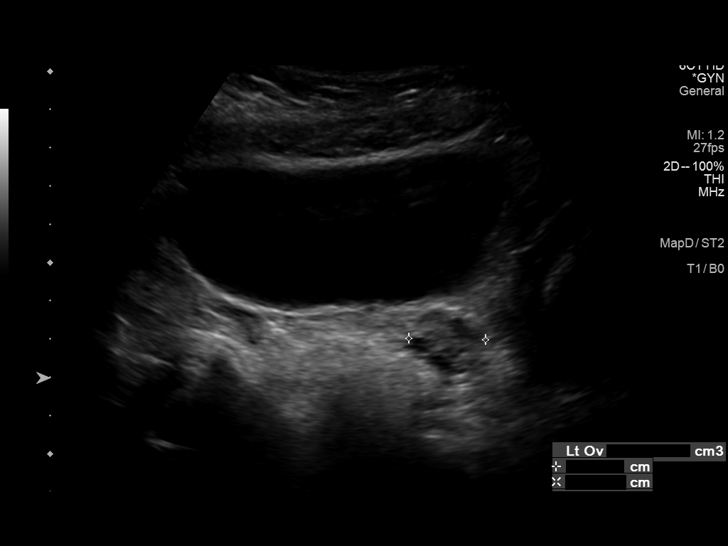
[im 27/80]
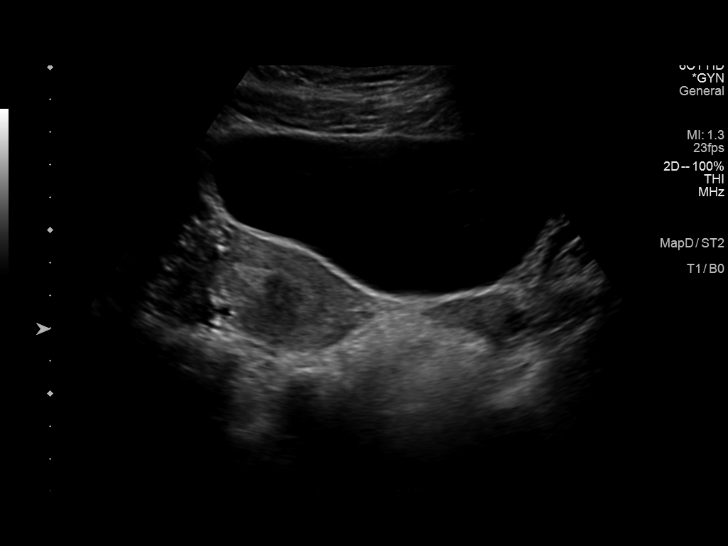
[im 33/80]
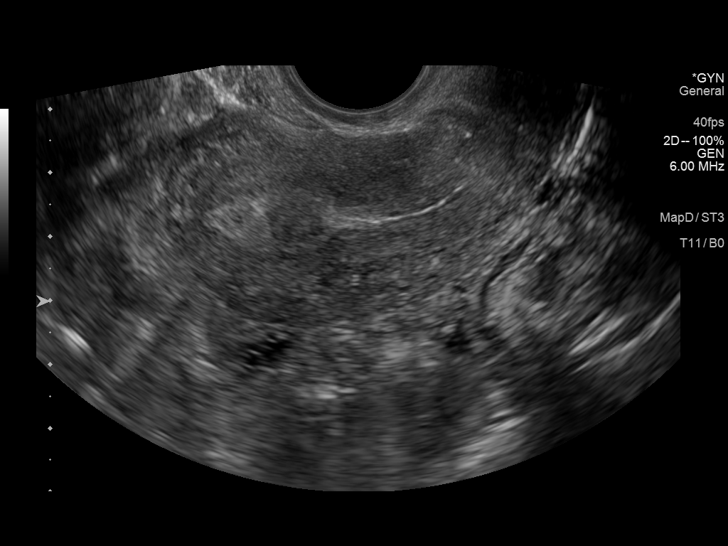
[im 40/80]
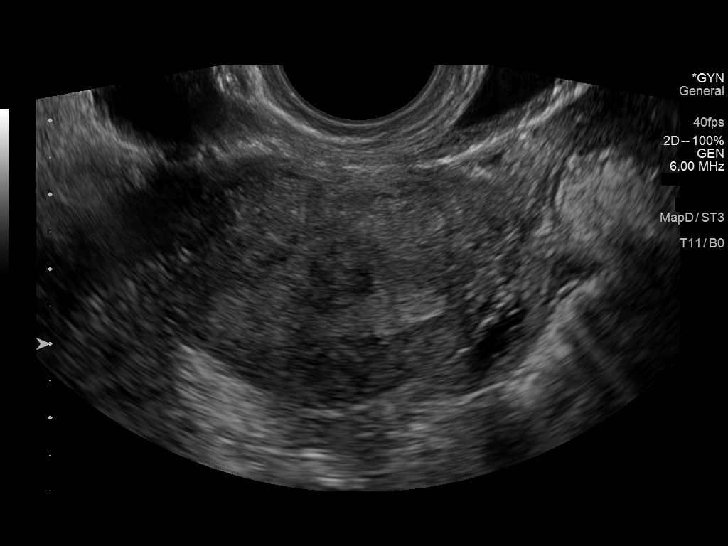
[im 47/80]
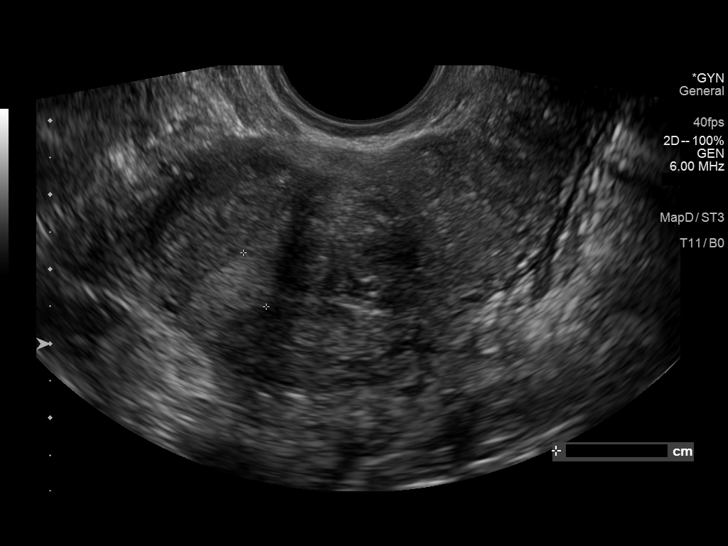
[im 53/80]
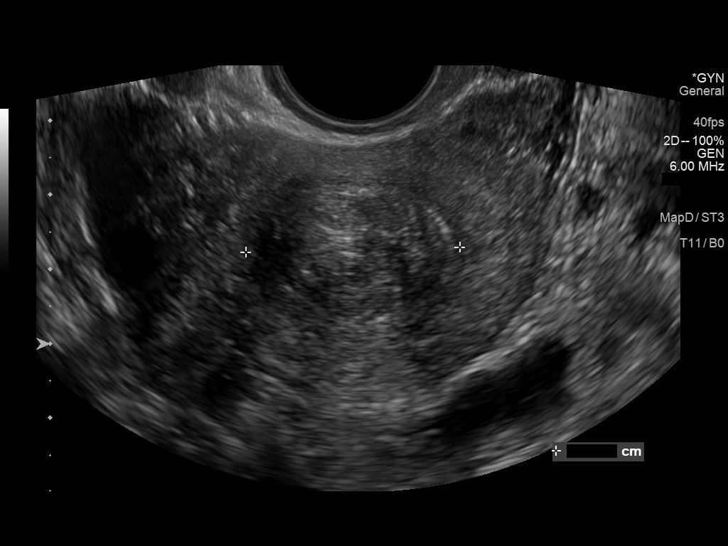
[im 60/80]
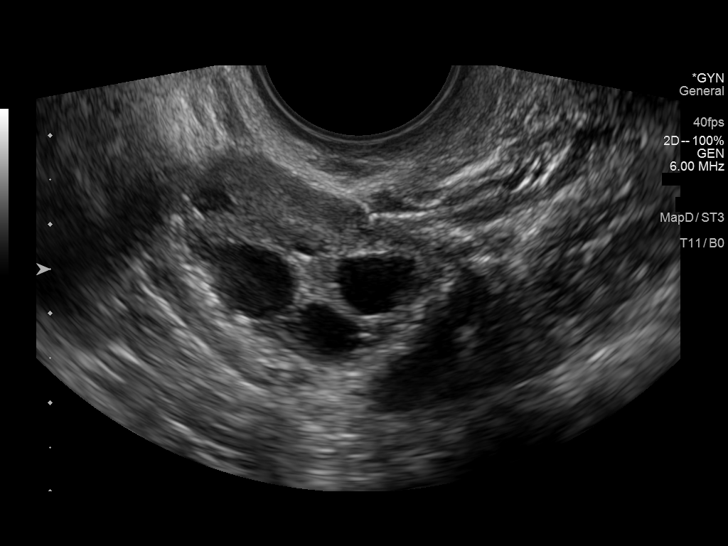
[im 66/80]
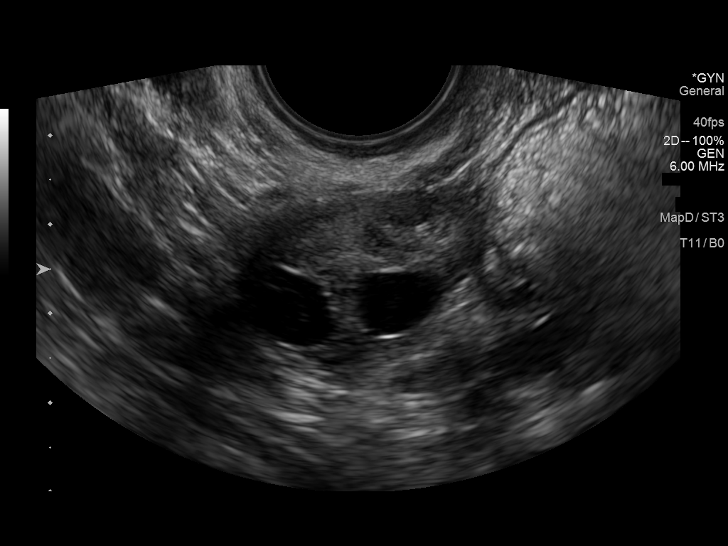
[im 73/80]
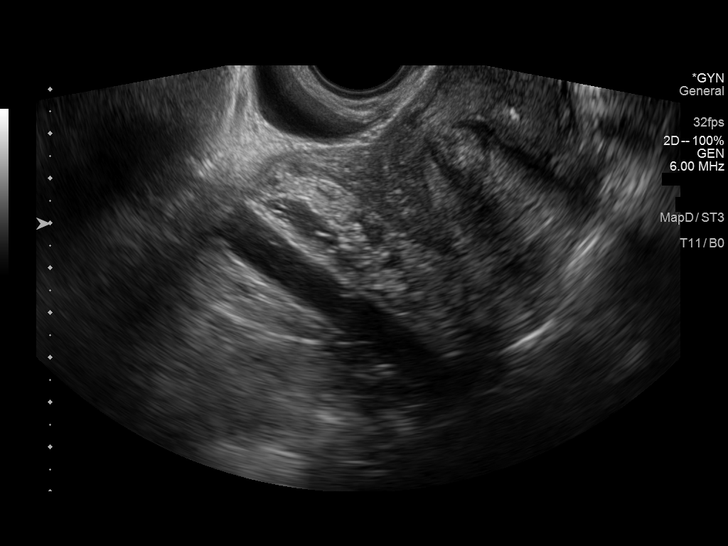
[im 80/80]
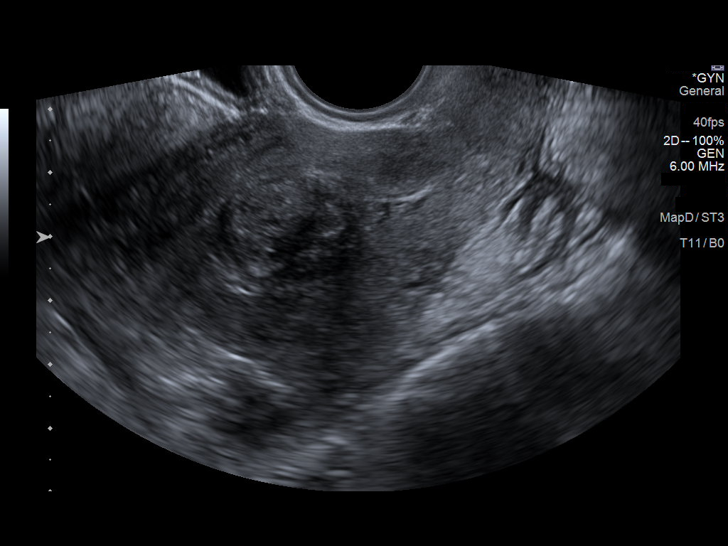

[13 of 25 positions shown; findings below may reference images not displayed]

FINDINGS: Uterus

Measurements: 8.9 x 3.5 x 4.2 cm = volume: 68.2 mL. Anteverted
uterus. No discernible uterine lesion or fibroid.

Endometrium

Thickness: 7.9 mm in double stripe endometrial thickness. There is
a focal, heterogeneous structure measuring 2.7 x 1.8 x 2.9 cm within
the endometrial canal demonstrating some hyperemic color flow (image
50/77).

Right ovary

Measurements: 3.0 x 1.8 x 1.9 cm = volume: 5.3 mL. Normal anechoic
follicles. No concerning adnexal lesion. Normal color flow in the
ovary.

Left ovary

Measurements: 3.0 x 1.8 x 1.7 cm = volume: 4.9 mL. Multiple anechoic
follicles. No concerning adnexal lesion. Normal color flow in the
ovaries.

Other findings

Trace anechoic free fluid in the posterior cul-de-sac, nonspecific
though often physiologic in a reproductive age female.
IMPRESSION: 1. Heterogeneous 2.9 cm structure in the endometrial canal with some
hyperemic color flow. Could reflect a submucosal fibroid large
endometrial polyp or more insidious endometrial lesion. Gynecologic
referral is recommended with further evaluation with
sonohysterography or hysteroscopy.
2. Trace anechoic free fluid in the posterior cul-de-sac,
nonspecific though can be physiologic in a age female.
3. Otherwise unremarkable pelvic ultrasound.

These results will be called to the ordering clinician or
representative by the Radiologist Assistant, and communication
documented in the PACS or [REDACTED].

## 2022-10-07 ENCOUNTER — Ambulatory Visit
Admission: RE | Admit: 2022-10-07 | Discharge: 2022-10-07 | Disposition: A | Discharge status: Home / Self Care | Payer: Medicaid Other | Source: Ambulatory Visit | Attending: Obstetrics and Gynecology | Admitting: Obstetrics and Gynecology

## 2022-10-07 DIAGNOSIS — Z9889 Other specified postprocedural states: Secondary | ICD-10-CM

## 2022-11-09 IMAGING — US US PELVIS COMPLETE
1 series · 13 of 25 positions shown · non-contrast
Comparison: Pelvic ultrasound 11/17/2020

CLINICAL DATA: Pelvic pain and bleeding.  Recent D and C.

EXAM:
TRANSABDOMINAL ULTRASOUND OF PELVIS
TECHNIQUE: Transabdominal ultrasound examination of the pelvis was performed
including evaluation of the uterus, ovaries, adnexal regions, and
pelvic cul-de-sac.

[Series 1: us pelvis (transabdominal only) · 13 of 39 slices shown]
[im 1/39]
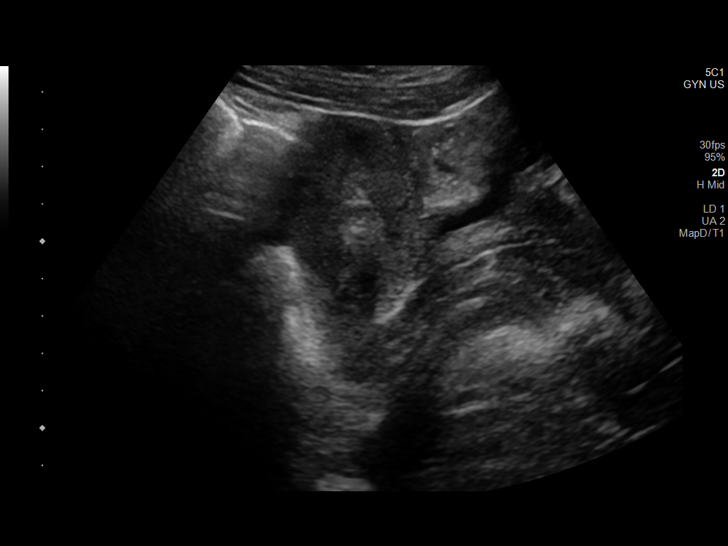
[im 4/39]
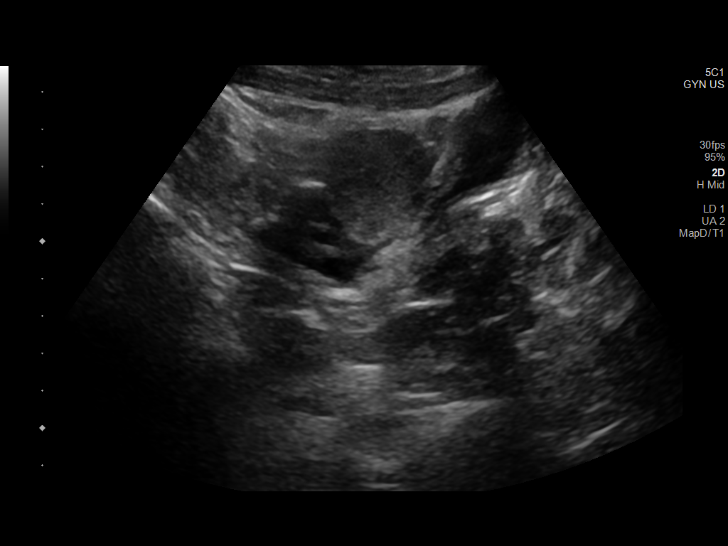
[im 7/39]
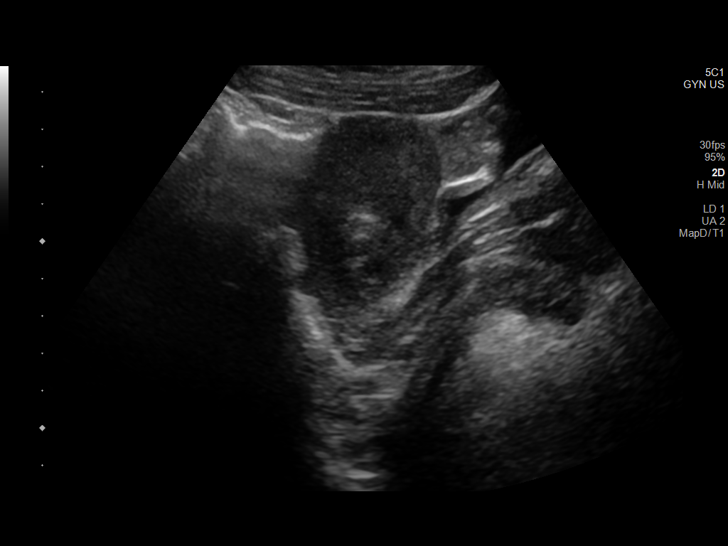
[im 10/39]
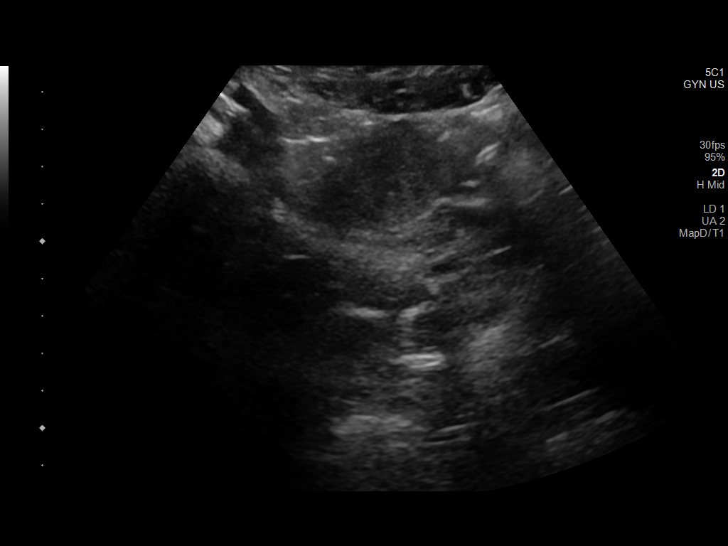
[im 13/39]
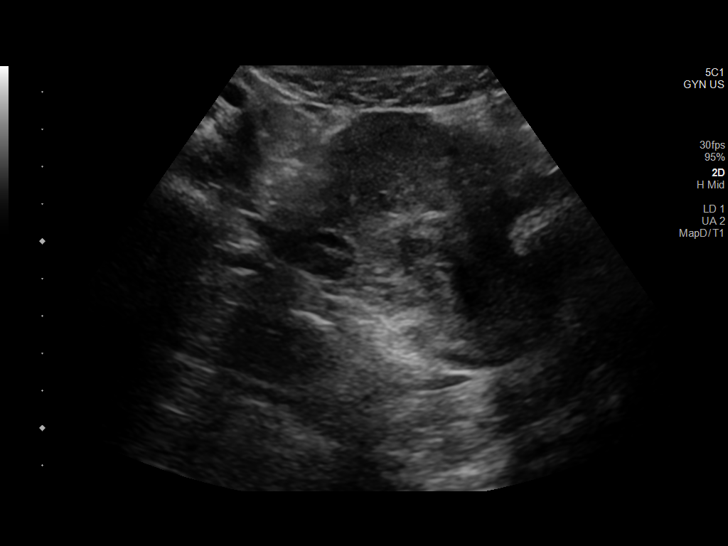
[im 16/39]
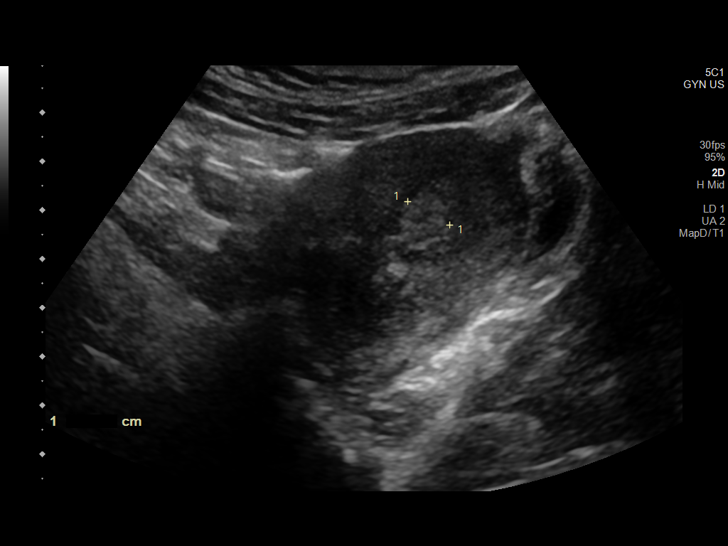
[im 20/39]
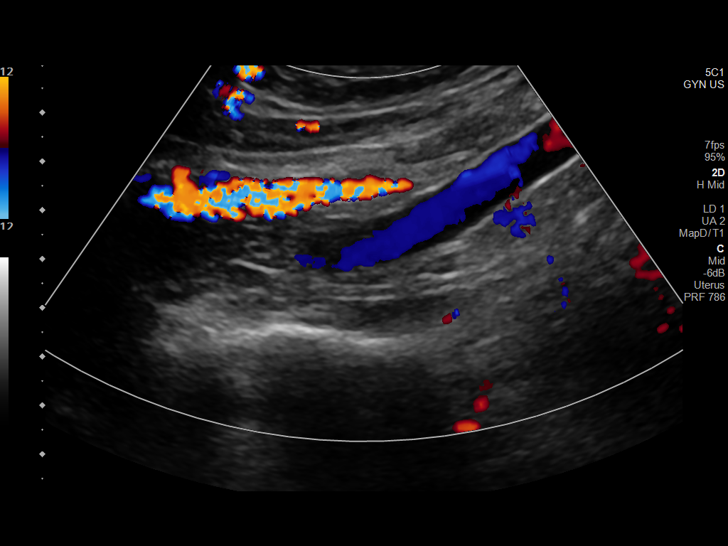
[im 23/39]
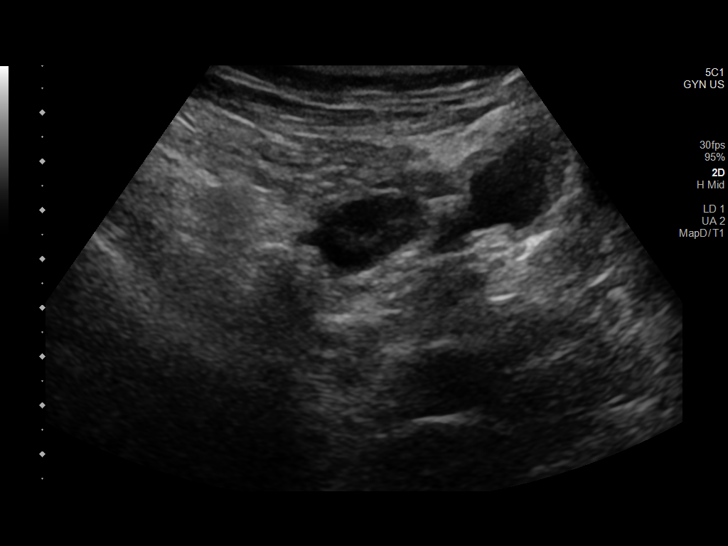
[im 26/39]
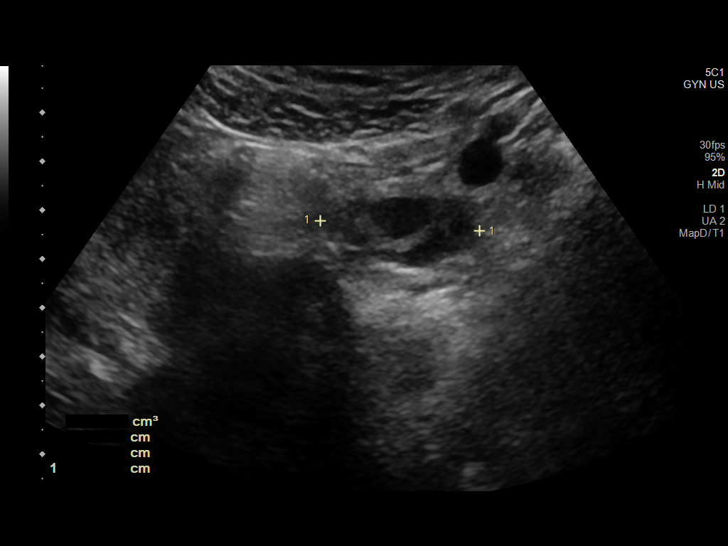
[im 29/39]
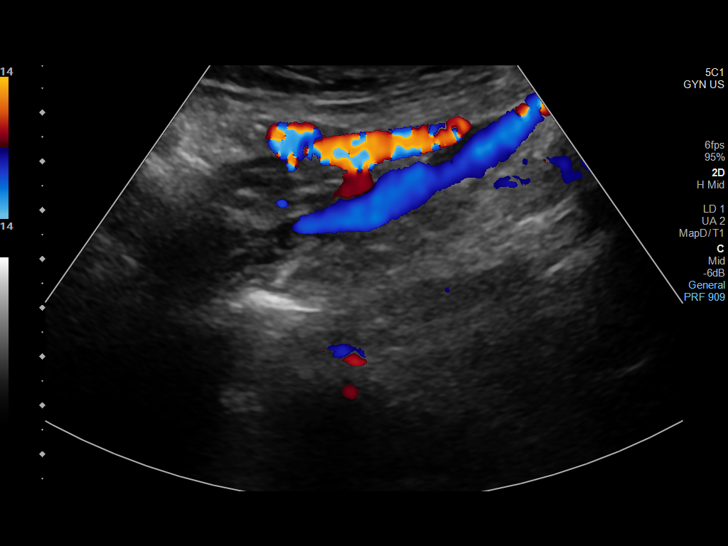
[im 32/39]
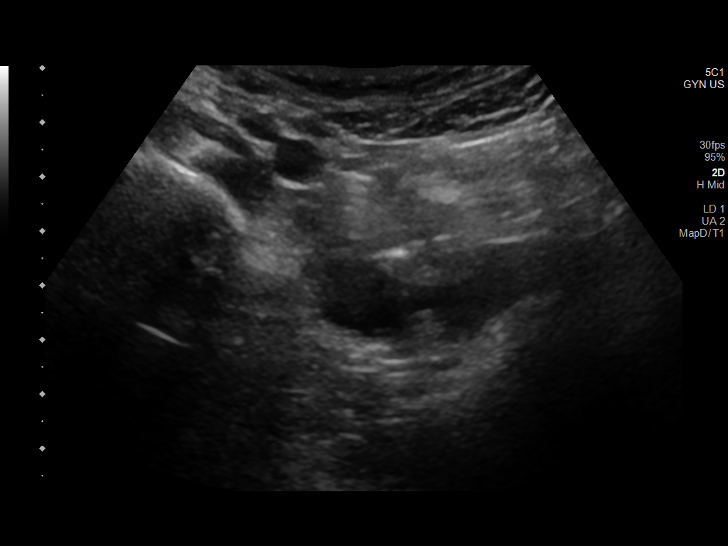
[im 35/39]
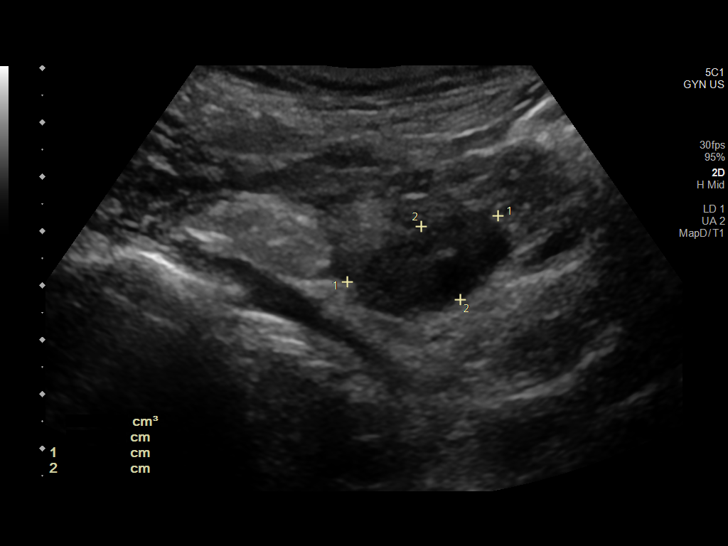
[im 39/39]
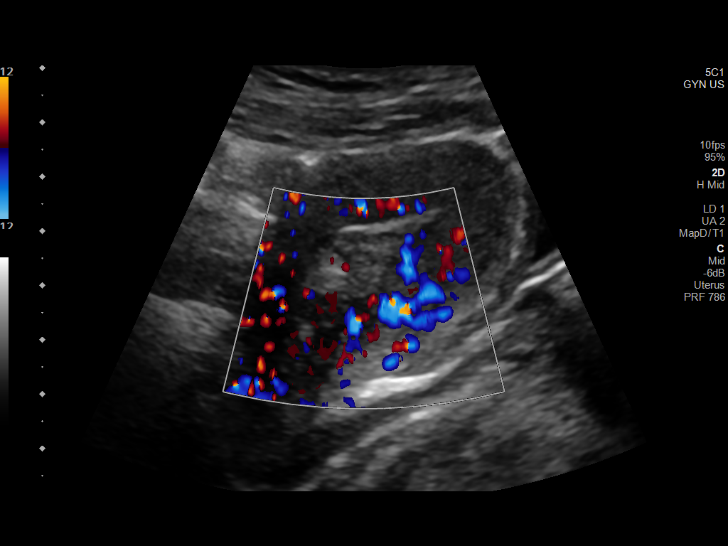

[13 of 25 positions shown; findings below may reference images not displayed]

FINDINGS: Uterus

Measurements: 7.3 x 4.0 x 5.1 cm = volume: 77 mL. Uterus is
anteverted. No fibroids or other mass visualized.

Endometrium

Thickness: 9.8 mm distally, 12 mm in the midportion. The endometrium
is heterogeneous with occasional areas of vascularity. The previous
2.9 cm endometrial lesion is not definitively seen on this
transabdominal exam.

Right ovary

Measurements: 3.0 x 1.5 x 2.3 cm = volume: 5.5 mL. Normal appearance
with small follicles. Blood flow is seen. No dominant cyst or
adnexal mass.

Left ovary

Measurements: 2.5 x 1.6 x 3.3 cm = volume: 6.8 mL. Small follicular
cyst measuring 11 mm, no suspicious lesion or adnexal mass.

Other findings:  No abnormal free fluid.
IMPRESSION: 1. The previous endometrial lesion is no longer seen. There is mild
endometrial irregularity measuring 9-12 mm with some vascularity,
may represent sequela of recent D and C. The possibility of
endometritis is also raised.
2. Normal sonographic appearance of the ovaries.

## 2022-11-12 ENCOUNTER — Ambulatory Visit: Payer: 59 | Admitting: Radiology

## 2022-11-21 ENCOUNTER — Ambulatory Visit: Payer: 59 | Admitting: Radiology

## 2022-11-22 ENCOUNTER — Ambulatory Visit: Payer: 59 | Admitting: Radiology

## 2022-12-05 ENCOUNTER — Ambulatory Visit (INDEPENDENT_AMBULATORY_CARE_PROVIDER_SITE_OTHER): Payer: 59 | Admitting: Family

## 2022-12-05 ENCOUNTER — Other Ambulatory Visit (HOSPITAL_COMMUNITY)
Admission: RE | Admit: 2022-12-05 | Discharge: 2022-12-05 | Disposition: A | Discharge status: Home / Self Care | Payer: 59 | Source: Ambulatory Visit | Attending: Family | Admitting: Family

## 2022-12-05 ENCOUNTER — Encounter: Payer: Self-pay | Admitting: Family

## 2022-12-05 VITALS — BP 114/82 | HR 73 | Temp 96.9°F | Ht 64.0 in | Wt 131.4 lb

## 2022-12-05 DIAGNOSIS — Z0000 Encounter for general adult medical examination without abnormal findings: Secondary | ICD-10-CM

## 2022-12-05 DIAGNOSIS — Z020 Encounter for examination for admission to educational institution: Secondary | ICD-10-CM

## 2022-12-05 LAB — CBC WITH DIFFERENTIAL/PLATELET
Basophils Absolute: 0 10*3/uL (ref 0.0–0.1)
Basophils Relative: 0.4 % (ref 0.0–3.0)
Eosinophils Absolute: 0.2 10*3/uL (ref 0.0–0.7)
Eosinophils Relative: 2.4 % (ref 0.0–5.0)
HCT: 40.6 % (ref 36.0–46.0)
Hemoglobin: 13.5 g/dL (ref 12.0–15.0)
Lymphocytes Relative: 39.8 % (ref 12.0–46.0)
Lymphs Abs: 3 10*3/uL (ref 0.7–4.0)
MCHC: 33.3 g/dL (ref 30.0–36.0)
MCV: 93 fl (ref 78.0–100.0)
Monocytes Absolute: 0.6 10*3/uL (ref 0.1–1.0)
Monocytes Relative: 7.5 % (ref 3.0–12.0)
Neutro Abs: 3.7 10*3/uL (ref 1.4–7.7)
Neutrophils Relative %: 49.9 % (ref 43.0–77.0)
Platelets: 211 10*3/uL (ref 150.0–400.0)
RBC: 4.37 Mil/uL (ref 3.87–5.11)
RDW: 14 % (ref 11.5–15.5)
WBC: 7.5 10*3/uL (ref 4.0–10.5)

## 2022-12-05 LAB — LIPID PANEL
Cholesterol: 156 mg/dL (ref 0–200)
HDL: 64.1 mg/dL (ref >39.00)
LDL Cholesterol: 80 mg/dL (ref 0–99)
NonHDL: 91.48
Total CHOL/HDL Ratio: 2
Triglycerides: 57 mg/dL (ref 0.0–149.0)
VLDL: 11.4 mg/dL (ref 0.0–40.0)

## 2022-12-05 LAB — COMPREHENSIVE METABOLIC PANEL
ALT: 8 U/L (ref 0–35)
AST: 15 U/L (ref 0–37)
Albumin: 4.3 g/dL (ref 3.5–5.2)
Alkaline Phosphatase: 50 U/L (ref 39–117)
BUN: 11 mg/dL (ref 6–23)
CO2: 27 mEq/L (ref 19–32)
Calcium: 9.2 mg/dL (ref 8.4–10.5)
Chloride: 104 mEq/L (ref 96–112)
Creatinine, Ser: 0.63 mg/dL (ref 0.40–1.20)
GFR: 122.42 mL/min (ref >60.00)
Glucose, Bld: 70 mg/dL (ref 70–99)
Potassium: 3.8 mEq/L (ref 3.5–5.1)
Sodium: 140 mEq/L (ref 135–145)
Total Bilirubin: 0.4 mg/dL (ref 0.2–1.2)
Total Protein: 6.8 g/dL (ref 6.0–8.3)

## 2022-12-05 LAB — TSH: TSH: 3.58 u[IU]/mL (ref 0.35–5.50)

## 2022-12-05 NOTE — Progress Notes (Signed)
Phone [phone]  Subjective:   Patient is a 27 y.o. female presenting for annual physical.    Chief Complaint  Patient presents with   Establish Care   Annual Exam    Fasting w/ labs    See problem oriented charting- ROS- full  review of systems was completed and negative.  The following were reviewed and entered/updated in epic: Past Medical History:  Diagnosis Date   Abnormal uterine bleeding (AUB) 10/16/2020   Anemia    Fibroid    Patient Active Problem List   Diagnosis Date Noted   Thickened endometrium    Refugee health examination 02/09/2013   Dysmenorrhea 02/09/2013   Pediatric body mass index (BMI) of 5th percentile to less than 85th percentile for age 18/24/2014   Past Surgical History:  Procedure Laterality Date   HYSTEROSCOPY N/A 12/24/2021   Procedure: Hysteroscopic Myomectomy with Myosure/ Dilation and Curettage;  Surgeon: Patton Salles, MD;  Location: [facility];  Service: Gynecology;  Laterality: N/A;   HYSTEROSCOPY WITH D & C N/A 12/20/2020   Procedure: DILATATION AND CURETTAGE /HYSTEROSCOPY with polyp removal;  Surgeon: Hermina Staggers, MD;  Location: [facility];  Service: Gynecology;  Laterality: N/A;   NO PAST SURGERIES      Family History  Problem Relation Age of Onset   Heart disease Brother     Medications- reviewed and updated Current Outpatient Medications  Medication Sig Dispense Refill   ferrous sulfate 325 (65 FE) MG EC tablet Take 1 tablet (325 mg total) by mouth 3 (three) times daily with meals. 90 tablet 3   No current facility-administered medications for this visit.   Allergies-reviewed and updated No Known Allergies  Social History   Social History Narrative   Not on file    Objective:  BP 114/82 (BP Location: Left Arm, Patient Position: Sitting, Cuff Size: Normal)   Pulse 73   Temp (!) 96.9 F (36.1 C) (Temporal)   Ht  (1.626 m)   Wt 131 lb 6.4 oz (59.6 kg)   LMP   (LMP Unknown)   SpO2 100%   BMI 22.55 kg/m  Physical Exam Vitals and nursing note reviewed.  Constitutional:      Appearance: Normal appearance. She is not ill-appearing.     Interventions: Face mask in place.  HENT:     Head: Normocephalic.     Right Ear: Tympanic membrane and ear canal normal.     Left Ear: Tympanic membrane and ear canal normal.     Nose:     Right Sinus: No frontal sinus tenderness.     Left Sinus: No frontal sinus tenderness.     Mouth/Throat:     Mouth: Mucous membranes are moist.     Pharynx: No pharyngeal swelling, oropharyngeal exudate, posterior oropharyngeal erythema or uvula swelling.     Tonsils: No tonsillar exudate or tonsillar abscesses.  Eyes:     Pupils: Pupils are equal, round, and reactive to light.  Cardiovascular:     Rate and Rhythm: Normal rate and regular rhythm.  Pulmonary:     Effort: Pulmonary effort is normal.     Breath sounds: Normal breath sounds.  Musculoskeletal:        General: Normal range of motion.     Cervical back: Normal range of motion.  Lymphadenopathy:     Head:     Right side of head: No preauricular or posterior auricular adenopathy.     Left side of head:  No preauricular or posterior auricular adenopathy.     Cervical: No cervical adenopathy.  Skin:    General: Skin is warm and dry.  Neurological:     Mental Status: She is alert.  Psychiatric:        Mood and Affect: Mood normal.        Behavior: Behavior normal.      Assessment and Plan   Health Maintenance counseling: 1. Anticipatory guidance: Patient counseled regarding regular dental exams q6 months, eye exams,  avoiding smoking and second hand smoke, limiting alcohol to 1 beverage per day, no illicit drugs.   2. Risk factor reduction:  Advised patient of need for regular exercise and diet rich with fruits and vegetables to reduce risk of heart attack and stroke. Wt Readings from Last 3 Encounters:  12/05/22 131 lb 6.4 oz (59.6 kg)  07/17/22 131 lb  (59.4 kg)  03/26/22 128 lb (58.1 kg)   3. Immunizations/screenings/ancillary studies Immunization History  Administered Date(s) Administered   Hepatitis B 02/09/2013   IPV 02/09/2013   Influenza Split 05/16/2015   MMR 02/09/2013   PPD Test 02/14/2022   Varicella 02/09/2013   Health Maintenance Due  Topic Date Due   COVID-19 Vaccine (1) Never done   HPV VACCINES (1 - 2-dose series) Never done   DTaP/Tdap/Td (1 - Tdap) Never done    4. Cervical cancer screening: due 2025 5. Skin cancer screening- advised regular sunscreen use. Denies worrisome, changing, or new skin lesions.  6. Birth control/STD check: none, condoms, checking STDs today 7. Smoking associated screening: non- smoker 8. Alcohol screening: rare  There are no diagnoses linked to this encounter.  Recommended follow up:  No follow-ups on file. Future Appointments  Date Time Provider Department Center  02/10/2023  9:30 AM Patton Salles, MD [facility] None    Lab/Order associations: fasting   Dulce Sellar, NP

## 2022-12-05 NOTE — Patient Instructions (Signed)
Welcome to [employer] [facility] [employer], It was a pleasure meeting you today!    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  Good luck with your externship!  For your allergies see the information below:  Use Nasal saline spray (i.e.,Simply Saline or other generic) or a nasal saline lavage (i.e.,NeilMed, Neti Pot) to flush allergens, general disinfecting &  prior to using medicated nasal sprays. Start Nasacort nasal spray 1 spray each nostril twice a day for 3 days, then reduce to daily use. 2 sprays twice a day is ok for really bad symptoms for 1 week, then reduce to 1 spray twice a day or daily. May add over the counter antihistamines such as Xyzal (levocetirizine), Zyrtec (cetirizine), Claritin (loratadine), or Allegra (fexofenadine) daily as needed. May take twice a day if needed as long as it does not cause drowsiness or too much dryness in nose, mouth or throat. May also use Pataday or generic over the counter eye drops: 1 drop in each eye daily as needed for itchy/watery eyes.        PLEASE NOTE: If you had any LAB tests please let us know if you have not heard back within a few days. You may see your results on MyChart before we have a chance to review them but we will give you a call once they are reviewed by [place]. If we ordered any REFERRALS today, please let us know if you have not heard from their office within the next week.  Let us know through MyChart if you are needing REFILLS, or have your pharmacy send [place] the request. You can also use MyChart to communicate with me or any office staff.

## 2022-12-05 NOTE — Progress Notes (Deleted)
   New Patient Office Visit  Subjective:  Patient ID: Gina Burke, female    DOB: June 21, 1996  Age: 27 y.o. MRN: [id number]  CC: No chief complaint on file.   HPI Meyer Dockery presents for establishing care today.  Assessment & Plan:  There are no diagnoses linked to this encounter.  Subjective:    Outpatient Medications Prior to Visit  Medication Sig Dispense Refill   ferrous sulfate 325 (65 FE) MG EC tablet Take 1 tablet (325 mg total) by mouth 3 (three) times daily with meals. (Patient not taking: Reported on 03/26/2022) 90 tablet 3   No facility-administered medications prior to visit.   Past Medical History:  Diagnosis Date   Abnormal uterine bleeding (AUB) 10/16/2020   Anemia    Fibroid    Past Surgical History:  Procedure Laterality Date   HYSTEROSCOPY N/A 12/24/2021   Procedure: Hysteroscopic Myomectomy with Myosure/ Dilation and Curettage;  Surgeon: Patton Salles, MD;  Location: [facility];  Service: Gynecology;  Laterality: N/A;   HYSTEROSCOPY WITH D & C N/A 12/20/2020   Procedure: DILATATION AND CURETTAGE /HYSTEROSCOPY with polyp removal;  Surgeon: Hermina Staggers, MD;  Location: [facility];  Service: Gynecology;  Laterality: N/A;   NO PAST SURGERIES      Objective:   Today's Vitals: There were no vitals taken for this visit.  Physical Exam Vitals and nursing note reviewed.  Constitutional:      Appearance: Normal appearance.  Cardiovascular:     Rate and Rhythm: Normal rate and regular rhythm.  Pulmonary:     Effort: Pulmonary effort is normal.     Breath sounds: Normal breath sounds.  Musculoskeletal:        General: Normal range of motion.  Skin:    General: Skin is warm and dry.  Neurological:     Mental Status: She is alert.  Psychiatric:        Mood and Affect: Mood normal.        Behavior: Behavior normal.     No orders of the defined types were placed in this encounter.   Dulce Sellar, NP

## 2022-12-06 LAB — URINE CYTOLOGY ANCILLARY ONLY
Chlamydia: NEGATIVE
Comment: NEGATIVE
Comment: NEGATIVE
Comment: NORMAL
Neisseria Gonorrhea: NEGATIVE
Trichomonas: NEGATIVE

## 2022-12-07 LAB — QUANTIFERON-TB GOLD PLUS
Mitogen-NIL: 9.92 IU/mL
NIL: 0.03 IU/mL
QuantiFERON-TB Gold Plus: NEGATIVE
TB1-NIL: 0 IU/mL
TB2-NIL: 0 IU/mL

## 2022-12-07 LAB — VARICELLA ZOSTER ANTIBODY, IGG: Varicella IgG: 418.2 index

## 2022-12-07 LAB — HIV ANTIBODY (ROUTINE TESTING W REFLEX): HIV 1&2 Ab, 4th Generation: NONREACTIVE

## 2022-12-07 LAB — MEASLES/MUMPS/RUBELLA IMMUNITY
Mumps IgG: 207 AU/mL
Rubella: 19 Index
Rubeola IgG: >300 AU/mL

## 2022-12-07 LAB — RPR: RPR Ser Ql: NONREACTIVE

## 2022-12-07 LAB — HEPATITIS B SURFACE ANTIBODY, QUANTITATIVE: Hep B S AB Quant (Post): >1000 m[IU]/mL (ref >=10)

## 2022-12-09 NOTE — Progress Notes (Signed)
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

## 2022-12-11 ENCOUNTER — Telehealth: Payer: Self-pay

## 2022-12-11 NOTE — Telephone Encounter (Signed)
Signed forms placed in folders at front desk for pick up.

## 2022-12-23 ENCOUNTER — Ambulatory Visit
Admission: EM | Admit: 2022-12-23 | Discharge: 2022-12-23 | Disposition: A | Discharge status: Home / Self Care | Payer: 59 | Attending: Urgent Care | Admitting: Urgent Care

## 2022-12-23 DIAGNOSIS — N3001 Acute cystitis with hematuria: Secondary | ICD-10-CM | POA: Insufficient documentation

## 2022-12-23 DIAGNOSIS — Z7251 High risk heterosexual behavior: Secondary | ICD-10-CM | POA: Diagnosis not present

## 2022-12-23 LAB — POCT URINALYSIS DIP (MANUAL ENTRY)
Bilirubin, UA: NEGATIVE
Glucose, UA: NEGATIVE mg/dL
Nitrite, UA: NEGATIVE
Protein Ur, POC: 100 mg/dL — AB
Spec Grav, UA: >=1.03 — AB (ref 1.010–1.025)
Urobilinogen, UA: 0.2 E.U./dL
pH, UA: 7 (ref 5.0–8.0)

## 2022-12-23 LAB — POCT URINE PREGNANCY: Preg Test, Ur: NEGATIVE

## 2022-12-23 MED ORDER — CEPHALEXIN 500 MG PO CAPS: 500.0000 mg | ORAL_CAPSULE | Freq: Two times a day (BID) | ORAL | 0 refills | Status: DC

## 2022-12-23 NOTE — ED Provider Notes (Signed)
[facility]  Note:  This document was prepared using Conservation officer, historic buildings and may include unintentional dictation errors.  MRN: [id number] DOB: [birth year]  Subjective:   Gina Burke is a 27 y.o. female presenting for 2 day history of acute onset lower back pain, pelvic pain.  Has also had 1 week history of persistent dysuria, urinary frequency, urinary urgency and having to go at least once an hour with sensation of incomplete emptying.  She believes it is possible that an STI is also the case as she recently had unprotected sex.  No vaginal discharge.  No current facility-administered medications for this encounter.  Current Outpatient Medications:    ferrous sulfate 325 (65 FE) MG EC tablet, Take 1 tablet (325 mg total) by mouth 3 (three) times daily with meals., Disp: 90 tablet, Rfl: 3   No Known Allergies  Past Medical History:  Diagnosis Date   Abnormal uterine bleeding (AUB) 10/16/2020   Anemia    Fibroid      Past Surgical History:  Procedure Laterality Date   HYSTEROSCOPY N/A 12/24/2021   Procedure: Hysteroscopic Myomectomy with Myosure/ Dilation and Curettage;  Surgeon: Patton Salles, MD;  Location: [facility];  Service: Gynecology;  Laterality: N/A;   HYSTEROSCOPY WITH D & C N/A 12/20/2020   Procedure: DILATATION AND CURETTAGE /HYSTEROSCOPY with polyp removal;  Surgeon: Hermina Staggers, MD;  Location: [facility];  Service: Gynecology;  Laterality: N/A;   NO PAST SURGERIES      Family History  Problem Relation Age of Onset   Heart disease Brother     Social History   Tobacco Use   Smoking status: Never   Smokeless tobacco: Never  Vaping Use   Vaping Use: Never used  Substance Use Topics   Alcohol use: Yes    Comment: occasional   Drug use: Never    ROS   Objective:   Vitals: BP 102/72 (BP Location: Left Arm)   Pulse 75   Temp 98.2 F (36.8 C) (Oral)   Resp 16    LMP  (Within Weeks) Comment: 1 week  SpO2 97%   Physical Exam Constitutional:      General: She is not in acute distress.    Appearance: Normal appearance. She is well-developed. She is not ill-appearing, toxic-appearing or diaphoretic.  HENT:     Head: Normocephalic and atraumatic.     Nose: Nose normal.     Mouth/Throat:     Mouth: Mucous membranes are moist.  Eyes:     General: No scleral icterus.       Right eye: No discharge.        Left eye: No discharge.     Extraocular Movements: Extraocular movements intact.     Conjunctiva/sclera: Conjunctivae normal.  Cardiovascular:     Rate and Rhythm: Normal rate.  Pulmonary:     Effort: Pulmonary effort is normal.  Abdominal:     General: Bowel sounds are normal. There is no distension.     Palpations: Abdomen is soft. There is no mass.     Tenderness: There is no abdominal tenderness. There is no right CVA tenderness, left CVA tenderness, guarding or rebound.  Skin:    General: Skin is warm and dry.  Neurological:     General: No focal deficit present.     Mental Status: She is alert and oriented to person, place, and time.  Psychiatric:  Mood and Affect: Mood normal.        Behavior: Behavior normal.        Thought Content: Thought content normal.        Judgment: Judgment normal.     Results for orders placed or performed during the hospital encounter of 12/23/22 (from the past 24 hour(s))  POCT urinalysis dipstick     Status: Abnormal   Collection Time: 12/23/22  6:50 PM  Result Value Ref Range   Color, UA yellow yellow   Clarity, UA clear clear   Glucose, UA negative negative mg/dL   Bilirubin, UA negative negative   Ketones, POC UA moderate (40) (A) negative mg/dL   Spec Grav, UA >=1.610 (A) 1.010 - 1.025   Blood, UA large (A) negative   pH, UA 7.0 5.0 - 8.0   Protein Ur, POC =100 (A) negative mg/dL   Urobilinogen, UA 0.2 0.2 or 1.0 E.U./dL   Nitrite, UA Negative Negative   Leukocytes, UA Small (1+) (A)  Negative  POCT urine pregnancy     Status: None   Collection Time: 12/23/22  6:52 PM  Result Value Ref Range   Preg Test, Ur Negative Negative    Assessment and Plan :   PDMP not reviewed this encounter.  1. Acute cystitis with hematuria   2. Unprotected sex     Start Keflex to cover for acute cystitis, urine culture pending.  Recommended aggressive hydration, limiting urinary irritants. Counseled patient on potential for adverse effects with medications prescribed/recommended today, ER and return-to-clinic precautions discussed, patient verbalized understanding.    Wallis Bamberg, [place] 12/23/22 [id number]

## 2022-12-23 NOTE — ED Triage Notes (Signed)
Pt reports increase urinary frequency and burning when urinating x 1 week; low abdomen pan, low back pain x 2 days.

## 2022-12-23 NOTE — Discharge Instructions (Addendum)

## 2022-12-24 ENCOUNTER — Ambulatory Visit: Payer: 59

## 2022-12-25 LAB — CERVICOVAGINAL ANCILLARY ONLY
Bacterial Vaginitis (gardnerella): NEGATIVE
Candida Glabrata: NEGATIVE
Candida Vaginitis: NEGATIVE
Chlamydia: NEGATIVE
Comment: NEGATIVE
Comment: NEGATIVE
Comment: NEGATIVE
Comment: NEGATIVE
Comment: NEGATIVE
Comment: NORMAL
Neisseria Gonorrhea: NEGATIVE
Trichomonas: NEGATIVE

## 2022-12-25 LAB — URINE CULTURE: Culture: >=100000 — AB

## 2023-01-27 NOTE — Progress Notes (Deleted)
27 y.o. G0P0000 Single African American female here for annual exam.    PCP:     No LMP recorded. (Menstrual status: Irregular Periods).           Sexually active: {yes no:314532}  The current method of family planning is {contraception:315051}.    Exercising: {yes no:314532}  {types:19826} Smoker:  {YES [id number]  Health Maintenance: Pap:  11/01/20 neg: HR HPV neg History of abnormal Pap:  no MMG:  n/a Colonoscopy:  n/a BMD:   n/a  Result  n/a TDaP:  *** Gardasil:   {YES NO:22349} HIV: 11/01/20 neg Hep C: 11/01/20 neg Screening Labs:  Hb today: ***, Urine today: ***   reports that she has never smoked. She has never used smokeless tobacco. She reports current alcohol use. She reports that she does not use drugs.  Past Medical History:  Diagnosis Date   Abnormal uterine bleeding (AUB) 10/16/2020   Anemia    Fibroid     Past Surgical History:  Procedure Laterality Date   HYSTEROSCOPY N/A 12/24/2021   Procedure: Hysteroscopic Myomectomy with Myosure/ Dilation and Curettage;  Surgeon: Patton Salles, MD;  Location: [facility];  Service: Gynecology;  Laterality: N/A;   HYSTEROSCOPY WITH D & C N/A 12/20/2020   Procedure: DILATATION AND CURETTAGE /HYSTEROSCOPY with polyp removal;  Surgeon: Hermina Staggers, MD;  Location: [facility];  Service: Gynecology;  Laterality: N/A;   NO PAST SURGERIES      Current Outpatient Medications  Medication Sig Dispense Refill   cephALEXin (KEFLEX) 500 MG capsule Take 1 capsule (500 mg total) by mouth 2 (two) times daily. 10 capsule 0   ferrous sulfate 325 (65 FE) MG EC tablet Take 1 tablet (325 mg total) by mouth 3 (three) times daily with meals. 90 tablet 3   No current facility-administered medications for this visit.    Family History  Problem Relation Age of Onset   Heart disease Brother     Review of Systems  Exam:   There were no vitals taken for this visit.    General appearance:  alert, cooperative and appears stated age Head: normocephalic, without obvious abnormality, atraumatic Neck: no adenopathy, supple, symmetrical, trachea midline and thyroid normal to inspection and palpation Lungs: clear to auscultation bilaterally Breasts: normal appearance, no masses or tenderness, No nipple retraction or dimpling, No nipple discharge or bleeding, No axillary adenopathy Heart: regular rate and rhythm Abdomen: soft, non-tender; no masses, no organomegaly Extremities: extremities normal, atraumatic, no cyanosis or edema Skin: skin color, texture, turgor normal. No rashes or lesions Lymph nodes: cervical, supraclavicular, and axillary nodes normal. Neurologic: grossly normal  Pelvic: External genitalia:  no lesions              No abnormal inguinal nodes palpated.              Urethra:  normal appearing urethra with no masses, tenderness or lesions              Bartholins and Skenes: normal                 Vagina: normal appearing vagina with normal color and discharge, no lesions              Cervix: no lesions              Pap taken: {yes no:314532} Bimanual Exam:  Uterus:  normal size, contour, position, consistency, mobility, non-tender  Adnexa: no mass, fullness, tenderness              Rectal exam: {yes no:314532}.  Confirms.              Anus:  normal sphincter tone, no lesions  Chaperone was present for exam:  ***  Assessment:   Well woman visit with gynecologic exam.   Plan: Mammogram screening discussed. Self breast awareness reviewed. Pap and HR HPV as above. Guidelines for Calcium, Vitamin D, regular exercise program including cardiovascular and weight bearing exercise.   Follow up annually and prn.   Additional counseling given.  {yes [id number]. _______ minutes face to face time of which over 50% was spent in counseling.    After visit summary provided.

## 2023-01-28 ENCOUNTER — Encounter: Payer: Self-pay | Admitting: Family Medicine

## 2023-01-28 ENCOUNTER — Ambulatory Visit (INDEPENDENT_AMBULATORY_CARE_PROVIDER_SITE_OTHER): Payer: 59 | Admitting: Family Medicine

## 2023-01-28 VITALS — BP 108/70 | HR 102 | Temp 98.1°F | Ht 64.0 in | Wt 134.6 lb

## 2023-01-28 DIAGNOSIS — N3090 Cystitis, unspecified without hematuria: Secondary | ICD-10-CM

## 2023-01-28 DIAGNOSIS — R300 Dysuria: Secondary | ICD-10-CM

## 2023-01-28 HISTORY — DX: Dysuria: R30.0

## 2023-01-28 HISTORY — DX: Cystitis, unspecified without hematuria: N30.90

## 2023-01-28 LAB — POCT URINALYSIS DIPSTICK
Bilirubin, UA: NEGATIVE
Glucose, UA: NEGATIVE
Ketones, UA: NEGATIVE
Nitrite, UA: NEGATIVE
Protein, UA: POSITIVE — AB
Spec Grav, UA: >=1.03 — AB (ref 1.010–1.025)
Urobilinogen, UA: 0.2 E.U./dL
pH, UA: 6 (ref 5.0–8.0)

## 2023-01-28 MED ORDER — NITROFURANTOIN MONOHYD MACRO 100 MG PO CAPS: 100.0000 mg | ORAL_CAPSULE | Freq: Two times a day (BID) | ORAL | 0 refills | Status: AC

## 2023-01-28 NOTE — Progress Notes (Signed)
Established Patient Office Visit   Subjective:  Patient ID: Gina Burke, female    DOB: 15-Jun-1996  Age: 27 y.o. MRN: [id number]  Chief Complaint  Patient presents with   Dysuria    Dysuria and urine urgency started this morning.     Dysuria  Associated symptoms include frequency and urgency.   Encounter Diagnoses  Name Primary?   Dysuria Yes   Cystitis    1 day history of urinary frequency with urgency and mild dysuria.  History of UTI 1 month ago treated with cephalexin.  She is sexually active and does admit that these came after intercourse.  She has no prior history of UTIs as a child.   Review of Systems  Constitutional: Negative.   HENT: Negative.    Eyes:  Negative for blurred vision, discharge and redness.  Respiratory: Negative.    Cardiovascular: Negative.   Gastrointestinal:  Negative for abdominal pain.  Genitourinary:  Positive for dysuria, frequency and urgency.  Musculoskeletal: Negative.  Negative for myalgias.  Skin:  Negative for rash.  Neurological:  Negative for tingling, loss of consciousness and weakness.  Endo/Heme/Allergies:  Negative for polydipsia.  I forget what they need is like maybe to [facility] some of all take that out   Current Outpatient Medications:    nitrofurantoin, macrocrystal-monohydrate, (MACROBID) 100 MG capsule, Take 1 capsule (100 mg total) by mouth 2 (two) times daily for 7 days., Disp: 14 capsule, Rfl: 0   cephALEXin (KEFLEX) 500 MG capsule, Take 1 capsule (500 mg total) by mouth 2 (two) times daily. (Patient not taking: Reported on 01/28/2023), Disp: 10 capsule, Rfl: 0   ferrous sulfate 325 (65 FE) MG EC tablet, Take 1 tablet (325 mg total) by mouth 3 (three) times daily with meals. (Patient not taking: Reported on 01/28/2023), Disp: 90 tablet, Rfl: 3   Objective:     BP 108/70 (BP Location: Right Arm, Patient Position: Sitting, Cuff Size: Normal)   Pulse (!) 102   Temp 98.1 F (36.7 C) (Temporal)   Ht 5\' 4"  (1.626 m)    Wt 134 lb 9.6 oz (61.1 kg)   SpO2 98%   BMI 23.10 kg/m    Physical Exam Constitutional:      General: She is not in acute distress.    Appearance: Normal appearance. She is not ill-appearing, toxic-appearing or diaphoretic.  HENT:     Head: Normocephalic and atraumatic.     Right Ear: External ear normal.     Left Ear: External ear normal.  Eyes:     General: No scleral icterus.       Right eye: No discharge.        Left eye: No discharge.     Extraocular Movements: Extraocular movements intact.     Conjunctiva/sclera: Conjunctivae normal.  Pulmonary:     Effort: Pulmonary effort is normal. No respiratory distress.  Abdominal:     General: There is no distension.     Tenderness: There is no abdominal tenderness. There is no right CVA tenderness or left CVA tenderness.  Skin:    General: Skin is warm and dry.  Neurological:     Mental Status: She is alert and oriented to person, place, and time.  Psychiatric:        Mood and Affect: Mood normal.        Behavior: Behavior normal.      Results for orders placed or performed in visit on 01/28/23  POCT Urinalysis Dipstick  Result Value Ref Range  Color, UA     Clarity, UA     Glucose, UA Negative Negative   Bilirubin, UA neg    Ketones, UA neg    Spec Grav, UA >=1.030 (A) 1.010 - 1.025   Blood, UA 2+    pH, UA 6.0 5.0 - 8.0   Protein, UA Positive (A) Negative   Urobilinogen, UA 0.2 0.2 or 1.0 E.U./dL   Nitrite, UA neg    Leukocytes, UA Trace (A) Negative   Appearance     Odor        The ASCVD Risk score (Arnett DK, et al., 2019) failed to calculate for the following reasons:   The 2019 ASCVD risk score is only valid for ages 70 to 35    Assessment & Plan:   Dysuria -     POCT urinalysis dipstick -     Nitrofurantoin Monohyd Macro; Take 1 capsule (100 mg total) by mouth 2 (two) times daily for 7 days.  Dispense: 14 capsule; Refill: 0 -     Urine Culture  Cystitis -     Nitrofurantoin Monohyd Macro; Take  1 capsule (100 mg total) by mouth 2 (two) times daily for 7 days.  Dispense: 14 capsule; Refill: 0 -     Urine Culture    Return follow up with Stephanie Hudnell in 2 weeks for recheck..  Macrobid twice daily for 7 days.  Encouraged her to finish it.  Increase fluid intake to improve hydration.  Information was given on dehydration and Macrobid as well as UTIs.  Advised her to try to urinate before and after sex.   Mliss Sax, MD

## 2023-01-29 LAB — URINE CULTURE
MICRO NUMBER:: 15067976
SPECIMEN QUALITY:: ADEQUATE

## 2023-02-10 ENCOUNTER — Telehealth: Payer: Self-pay | Admitting: Family

## 2023-02-10 ENCOUNTER — Ambulatory Visit: Payer: 59 | Admitting: Obstetrics and Gynecology

## 2023-02-10 NOTE — Telephone Encounter (Signed)
Error encounter. 

## 2023-02-18 ENCOUNTER — Ambulatory Visit (INDEPENDENT_AMBULATORY_CARE_PROVIDER_SITE_OTHER): Payer: 59 | Admitting: Nurse Practitioner

## 2023-02-18 ENCOUNTER — Encounter: Payer: Self-pay | Admitting: Nurse Practitioner

## 2023-02-18 VITALS — BP 110/60 | HR 72 | Ht 64.0 in | Wt 138.0 lb

## 2023-02-18 DIAGNOSIS — Z113 Encounter for screening for infections with a predominantly sexual mode of transmission: Secondary | ICD-10-CM | POA: Diagnosis not present

## 2023-02-18 DIAGNOSIS — Z01419 Encounter for gynecological examination (general) (routine) without abnormal findings: Secondary | ICD-10-CM

## 2023-02-18 NOTE — Progress Notes (Signed)
   Chiziterem [facility] 12-07-1995 [id number]   History:  27 y.o. G0 presents for annual exam. Monthly cycles. Normal pap history. H/O submucosal fibroid resection 12/2021. During procedure right tube appeared to be occluded. Normal HSG 09/2022.   Gynecologic History Patient's last menstrual period was 02/10/2023. Period Cycle (Days): 28 Period Duration (Days): 3-5 Period Pattern: Regular Menstrual Flow: Moderate Menstrual Control: Thin pad Menstrual Control Change Freq (Hours): 6 Dysmenorrhea: (!) Mild Dysmenorrhea Symptoms: Cramping Contraception/Family planning: coitus interruptus Sexually active: Yes  Health Maintenance Last Pap: 11/01/2020. Results were: Normal neg HPV, 5-year repeat Last mammogram: Not indicated  Last colonoscopy: Not indicated  Last Dexa: Not indicated   Past medical history, past surgical history, family history and social history were all reviewed and documented in the EPIC chart. CMA at [facility].   ROS:  A ROS was performed and pertinent positives and negatives are included.  Exam:  Vitals:   02/18/23 1154  BP: 110/60  Pulse: 72  SpO2: 100%  Weight: 138 lb (62.6 kg)  Height: 5\' 4"  (1.626 m)   Body mass index is 23.69 kg/m.  General appearance:  Normal Thyroid:  Symmetrical, normal in size, without palpable masses or nodularity. Respiratory  Auscultation:  Clear without wheezing or rhonchi Cardiovascular  Auscultation:  Regular rate, without rubs, murmurs or gallops  Edema/varicosities:  Not grossly evident Abdominal  Soft,nontender, without masses, guarding or rebound.  Liver/spleen:  No organomegaly noted  Hernia:  None appreciated  Skin  Inspection:  Grossly normal Breasts: Examined lying and sitting.   Right: Without masses, retractions, nipple discharge or axillary adenopathy.   Left: Without masses, retractions, nipple discharge or axillary adenopathy. Genitourinary   Inguinal/mons:  Normal without inguinal  adenopathy  External genitalia:  Normal appearing vulva with no masses, tenderness, or lesions  BUS/Urethra/Skene's glands:  Normal  Vagina:  Normal appearing with normal color and discharge, no lesions  Cervix:  Normal appearing without discharge or lesions  Uterus:  Normal in size, shape and contour.  Midline and mobile, nontender  Adnexa/parametria:     Rt: Normal in size, without masses or tenderness.   Lt: Normal in size, without masses or tenderness.  Anus and perineum: Normal  Digital rectal exam: Deferred  Patient informed chaperone available to be present for breast and pelvic exam. Patient has requested no chaperone to be present. Patient has been advised what will be completed during breast and pelvic exam.   Assessment/Plan:  27 y.o. G0 for annual exam.   Well female exam with routine gynecological exam - Education provided on SBEs, importance of preventative screenings, current guidelines, high calcium diet, regular exercise, and multivitamin daily.  Labs with PCP.   Screening examination for STD (sexually transmitted disease) - Plan: C. trachomatis/N. gonorrhoeae RNA, RPR, HIV Antibody (routine testing w rflx)  Screening for cervical cancer - Normal Pap history.  Will repeat at 5-year interval per guidelines.   Return in 1 year for annual or sooner if needed.     Olivia Mackie DNP, 12:16 PM 02/18/2023

## 2023-02-19 LAB — HIV ANTIBODY (ROUTINE TESTING W REFLEX): HIV 1&2 Ab, 4th Generation: NONREACTIVE

## 2023-02-19 LAB — C. TRACHOMATIS/N. GONORRHOEAE RNA
C. trachomatis RNA, TMA: NOT DETECTED
N. gonorrhoeae RNA, TMA: NOT DETECTED

## 2023-02-19 LAB — RPR: RPR Ser Ql: NONREACTIVE

## 2023-03-28 ENCOUNTER — Ambulatory Visit: Payer: 59 | Admitting: Family

## 2023-04-12 DIAGNOSIS — J309 Allergic rhinitis, unspecified: Secondary | ICD-10-CM | POA: Diagnosis not present

## 2023-04-12 DIAGNOSIS — Z8249 Family history of ischemic heart disease and other diseases of the circulatory system: Secondary | ICD-10-CM | POA: Diagnosis not present

## 2023-06-18 DIAGNOSIS — U071 COVID-19: Secondary | ICD-10-CM | POA: Diagnosis not present

## 2023-07-11 ENCOUNTER — Ambulatory Visit: Payer: 59 | Admitting: Family

## 2023-08-28 ENCOUNTER — Ambulatory Visit: Payer: 59 | Admitting: Allergy

## 2023-08-28 ENCOUNTER — Encounter: Payer: Self-pay | Admitting: Allergy

## 2023-08-28 ENCOUNTER — Other Ambulatory Visit: Payer: Self-pay

## 2023-08-28 VITALS — BP 114/80 | HR 86 | Temp 98.1°F | Resp 18 | Ht 64.5 in | Wt 141.1 lb

## 2023-08-28 DIAGNOSIS — R233 Spontaneous ecchymoses: Secondary | ICD-10-CM | POA: Diagnosis not present

## 2023-08-28 DIAGNOSIS — H1013 Acute atopic conjunctivitis, bilateral: Secondary | ICD-10-CM | POA: Diagnosis not present

## 2023-08-28 DIAGNOSIS — H109 Unspecified conjunctivitis: Secondary | ICD-10-CM

## 2023-08-28 DIAGNOSIS — J310 Chronic rhinitis: Secondary | ICD-10-CM | POA: Diagnosis not present

## 2023-08-28 MED ORDER — RYALTRIS 665-25 MCG/ACT NA SUSP: 2.0000 | Freq: Two times a day (BID) | NASAL | 5 refills | Status: DC | PRN

## 2023-08-28 MED ORDER — OLOPATADINE HCL 0.2 % OP SOLN
1.0000 [drp] | Freq: Every day | OPHTHALMIC | 5 refills | Status: DC | PRN
Start: 2023-08-28 — End: 2023-11-07

## 2023-08-28 MED ORDER — LEVOCETIRIZINE DIHYDROCHLORIDE 5 MG PO TABS: 5.0000 mg | ORAL_TABLET | Freq: Every evening | ORAL | 5 refills | Status: DC

## 2023-08-28 NOTE — Patient Instructions (Addendum)
 Rhinoconjunctivitis Chronic symptoms of watery, itchy eyes, nasal congestion, and runny nose. Symptoms are year-round and have been present since last year. Over-the-counter antihistamines and nasal sprays have not been effective. -Schedule skin testing to identify environmental allergens.  Hold all antihistamines for 3 days prior to this visit -Try Xyzal  (levocetirizine) daily.  This is a long-acting daily antihistamine that is over-the-counter.   If this is not effective then will try prescription Carbinoxamine.  -Start Ryaltris  nasal spray for congestion and runniness.  Use 2 sprays each nostril twice a day as needed.  This goes to a specialty pharmacy and they will contact you and mail it to you.  -Consider saline nasal rinses to improve effectiveness of medicated nasal spray.   Use saline rinse kit prior to nasal spray use.  Use distilled water or boil water and bring to room temperature (do not use tap water).  Keep mouth open during the rinse.  -Use Pataday  1 drop each eye daily as needed for itchy watery eyes -Consider allergy  shots if medications remain ineffective after identifying allergens.  Will discuss this in more detail after testing.   Unexplained Bruising New onset of unexplained, large random bruises since October. -Order CBC and clotting studies to assess for potential hematological causes. -Consider referral to a hematologist if CBC results are abnormal.  Ear Wax Buildup Reports frequent ear wax buildup. -Continue self-cleaning regimen as no significant wax buildup observed during examination.  Follow-up Schedule follow-up appointment for skin testing next week.

## 2023-08-28 NOTE — Progress Notes (Signed)
 New Patient Note  RE: Gina Burke MRN: [id number] DOB: [birth year] Date of Office Visit: 08/28/2023  Primary care provider: Lucius Krabbe, NP  Chief Complaint: allergies  History of present illness: Gina Burke is a 28 y.o. female presenting today for evaluation of allergy .   Discussed the use of AI scribe software for clinical note transcription with the patient, who gave verbal consent to proceed.  The patient presents with a year-long history of persistent nasal congestion, runny nose, and itchy, watery eyes. The symptoms are not seasonal and occur throughout the year. The patient also reports occasional headaches, localized in the frontal region, and pressure or pain in the sinuses and cheeks. There are sporadic instances of ear itchiness, fullness, and crackling. The patient also reports frequent throat clearing.  Despite trying a variety of over-the-counter allergy  medications, including Zyrtec, Allegra, Claritin, Benadryl, and a nasal spray, the patient reports no significant relief. The patient has used these medications as needed, but she has not been effective in managing the symptoms. The patient has no history of asthma, eczema, or food allergies.  In addition to the allergy  symptoms, the patient reports the recent onset of unexplained bruising, which started around October 2024. The patient describes the bruises as large and sore, appearing randomly on body without any known trauma or injury. The patient denies any associated illness or changes in health around the time these symptoms started.  No history of bleeding disorders but does mention she has had anemia.  She provided picture of a bruise she had on her upper thigh that was large and more linear.  Review of systems: 10pt ROS negative unless noted above in HPI  All other systems negative unless noted above in HPI  Past medical history: Past Medical History:  Diagnosis Date   Abnormal uterine bleeding (AUB)  10/16/2020   Anemia    Fibroid     Past surgical history: Past Surgical History:  Procedure Laterality Date   HYSTEROSCOPY N/A 12/24/2021   Procedure: Hysteroscopic Myomectomy with Myosure/ Dilation and Curettage;  Surgeon: Cathlyn JAYSON Nikki Bobie FORBES, MD;  Location: [facility];  Service: Gynecology;  Laterality: N/A;   HYSTEROSCOPY WITH D & C N/A 12/20/2020   Procedure: DILATATION AND CURETTAGE /HYSTEROSCOPY with polyp removal;  Surgeon: Lorence Ozell CROME, MD;  Location: [facility];  Service: Gynecology;  Laterality: N/A;   NO PAST SURGERIES      Family history:  Family History  Problem Relation Age of Onset   Heart disease Brother     Social history: Lives in a home with carpeting in the bedroom with gas heating.  No pets in the home.  There is no concern for water damage, mildew or roaches in the home.  She is a engineer, site.  She denies a smoking history.   Medication List: No current outpatient medications on file.   No current facility-administered medications for this visit.    Known medication allergies: No Known Allergies   Physical examination: Blood pressure 114/80, pulse 86, temperature 98.1 F (36.7 C), resp. rate 18, height 5' 4.5 (1.638 m), weight 141 lb 1.6 oz (64 kg), SpO2 100%.  General: Alert, interactive, in no acute distress. HEENT: PERRLA, TMs pearly gray, turbinates moderately edematous with clear discharge, post-pharynx non erythematous. Neck: Supple without lymphadenopathy. Lungs: Clear to auscultation without wheezing, rhonchi or rales. {no increased work of breathing. CV: Normal S1, S2 without murmurs. Abdomen: Nondistended, nontender. Skin: Warm and dry, without lesions or rashes.  Extremities:  No clubbing, cyanosis or edema. Neuro:   Grossly intact.  Diagnositics/Labs: None today No recent lab work  Assessment and plan:   Rhinoconjunctivitis Chronic symptoms of watery, itchy eyes, nasal congestion,  and runny nose. Symptoms are year-round and have been present since last year. Over-the-counter antihistamines and nasal sprays have not been effective. -Schedule skin testing to identify environmental allergens.  Hold all antihistamines for 3 days prior to this visit -Try Xyzal  (levocetirizine) daily.  This is a long-acting daily antihistamine that is over-the-counter.   If this is not effective then will try prescription Carbinoxamine.  -Start Ryaltris  nasal spray for congestion and runniness.  Use 2 sprays each nostril twice a day as needed.  This goes to a specialty pharmacy and they will contact you and mail it to you.  -Consider saline nasal rinses to improve effectiveness of medicated nasal spray.   Use saline rinse kit prior to nasal spray use.  Use distilled water or boil water and bring to room temperature (do not use tap water).  Keep mouth open during the rinse.  -Use Pataday  1 drop each eye daily as needed for itchy watery eyes -Consider allergy  shots if medications remain ineffective after identifying allergens.  Will discuss this in more detail after testing.   Unexplained Bruising New onset of unexplained, large random bruises since October. -Order CBC and clotting studies to assess for potential hematological causes. -Consider referral to a hematologist if CBC results are abnormal.  Follow-up Schedule follow-up appointment for skin testing next week.  I appreciate the opportunity to take part in Gina Burke's care. Please do not hesitate to contact me with questions.  Sincerely,   Danita Brain, MD Allergy /Immunology [facility]

## 2023-08-29 LAB — CBC WITH DIFFERENTIAL/PLATELET
Basophils Absolute: 0 10*3/uL (ref 0.0–0.2)
Basos: 0 %
EOS (ABSOLUTE): 0.2 10*3/uL (ref 0.0–0.4)
Eos: 4 %
Hematocrit: 43.7 % (ref 34.0–46.6)
Hemoglobin: 14.6 g/dL (ref 11.1–15.9)
Immature Grans (Abs): 0 10*3/uL (ref 0.0–0.1)
Immature Granulocytes: 0 %
Lymphocytes Absolute: 1.8 10*3/uL (ref 0.7–3.1)
Lymphs: 48 %
MCH: 31.5 pg (ref 26.6–33.0)
MCHC: 33.4 g/dL (ref 31.5–35.7)
MCV: 94 fL (ref 79–97)
Monocytes Absolute: 0.4 10*3/uL (ref 0.1–0.9)
Monocytes: 10 %
Neutrophils Absolute: 1.4 10*3/uL (ref 1.4–7.0)
Neutrophils: 38 %
Platelets: 210 10*3/uL (ref 150–450)
RBC: 4.64 x10E6/uL (ref 3.77–5.28)
RDW: 12.1 % (ref 11.7–15.4)
WBC: 3.8 10*3/uL (ref 3.4–10.8)

## 2023-08-29 LAB — PT AND PTT
INR: 1 (ref 0.9–1.2)
Prothrombin Time: 11.3 s (ref 9.1–12.0)
aPTT: 30 s (ref 24–33)

## 2023-09-05 ENCOUNTER — Ambulatory Visit (INDEPENDENT_AMBULATORY_CARE_PROVIDER_SITE_OTHER): Payer: 59 | Admitting: Allergy

## 2023-09-05 ENCOUNTER — Encounter: Payer: Self-pay | Admitting: Allergy

## 2023-09-05 DIAGNOSIS — H1013 Acute atopic conjunctivitis, bilateral: Secondary | ICD-10-CM | POA: Diagnosis not present

## 2023-09-05 DIAGNOSIS — J302 Other seasonal allergic rhinitis: Secondary | ICD-10-CM | POA: Diagnosis not present

## 2023-09-05 DIAGNOSIS — J3089 Other allergic rhinitis: Secondary | ICD-10-CM | POA: Diagnosis not present

## 2023-09-05 NOTE — Patient Instructions (Signed)
Rhinoconjunctivitis Chronic symptoms of watery, itchy eyes, nasal congestion, and runny nose. Symptoms are year-round and have been present since last year. Over-the-counter antihistamines and nasal sprays have not been effective.  - Testing today showed: grasses, ragweed, weeds, trees, indoor molds, outdoor molds, and cat - Copy of test results provided.  - Avoidance measures provided.  - Continue with: -Xyzal (levocetirizine) daily.  If this is not effective then will try prescription Carbinoxamine.  -Ryaltris nasal spray for congestion and runniness.  Use 2 sprays each nostril twice a day as needed.  This goes to a specialty pharmacy and they will contact you and mail it to you.  -Consider saline nasal rinses to improve effectiveness of medicated nasal spray.   Use saline rinse kit prior to nasal spray use.  Use distilled water or boil water and bring to room temperature (do not use tap water).  Keep mouth open during the rinse.  -Use Pataday 1 drop each eye daily as needed for itchy watery eyes - You can use an extra dose of the antihistamine, if needed, for breakthrough symptoms.   - Consider allergy shots as a means of long-term control. - Allergy shots "re-train" and "reset" the immune system to ignore environmental allergens and decrease the resulting immune response to those allergens (sneezing, itchy watery eyes, runny nose, nasal congestion, etc).    - Allergy shots improve symptoms in 75-85% of patients.  - We can discuss more at a future appointment if the medications are not working for you.  Unexplained Bruising New onset of unexplained, large random bruises since October. -CBC and clotting studies are normal -Recommend discussing with PCP for next steps   Follow-up 4-6 months or sooner if needed

## 2023-09-05 NOTE — Progress Notes (Signed)
Follow-up Note  RE: Gina Burke MRN: [id number] DOB: [birth year] Date of Office Visit: 09/05/2023   History of present illness: Gina Burke is a 28 y.o. female presenting today for  skin testing visit.  She was last seen in the office on 08/28/2023 by myself for rhinoconjunctivitis.  She has held antihistamines for at least 2 days for testing today.  She is in her usual state of health today.  Medication List: Current Outpatient Medications  Medication Sig Dispense Refill   levocetirizine (XYZAL) 5 MG tablet Take 1 tablet (5 mg total) by mouth every evening. 30 tablet 5   Olopatadine HCl 0.2 % SOLN Apply 1 drop to eye daily as needed (Itchy, watery eyes). 2.5 mL 5   RYALTRIS 665-25 MCG/ACT SUSP Place 2 sprays into the nose 2 (two) times daily as needed. 29 g 5   No current facility-administered medications for this visit.     Known medication allergies: No Known Allergies Diagnositics/Labs:  Allergy testing:   Airborne Adult Perc - 09/05/23 1500     Time Antigen Placed 1448    Allergen Manufacturer Waynette Buttery    Location Back    Number of Test 55    1. Control-Buffer 50% Glycerol Negative    2. Control-Histamine 2+    3. Bahia Negative    4. French Southern Territ[place] Negative    5. Johnson Negative    6. Kentucky Blue Negative    7. Meadow Fescue Negative    8. Perennial Rye Negative    9. Timothy Negative    10. Ragweed Mix Negative    11. Cocklebur Negative    12. Plantain,  English 2+    13. Baccharis Negative    14. Dog Fennel Negative    15. Russian Thistle Negative    16. Lamb's Quarters 2+    17. Sheep Sorrell 2+    18. Rough Pigweed Negative    19. Marsh Elder, Rough Negative    20. Mugwort, Common Negative    21. Box, Elder Negative    22. Cedar, red Negative    23. Sweet Gum 2+    24. Pecan Pollen Negative    25. Pine Mix Negative    26. Walnut, Black Pollen 2+    27. Red Mulberry Negative    28. Ash Mix Negative    29. Birch Mix Negative    30. Beech American 2+     31. Cottonwood, [place] Negative    32. Hickory, White 2+    33. Maple Mix Negative    34. Oak, [place] Mix Negative    35. Sycamore Eastern Negative    36. Alternaria Alternata Negative    37. Cladosporium Herbarum Negative    38. Aspergillus Mix Negative    39. Penicillium Mix Negative    40. Bipolaris Sorokiniana (Helminthosporium) Negative    41. Drechslera Spicifera (Curvularia) Negative    42. Mucor Plumbeus Negative    43. Fusarium Moniliforme Negative    44. Aureobasidium Pullulans (pullulara) Negative    45. Rhizopus Oryzae Negative    46. Botrytis Cinera Negative    47. Epicoccum Nigrum Negative    48. Phoma Betae Negative    49. Dust Mite Mix Negative    50. Cat Hair 10,000 BAU/ml 2+    51.  Dog Epithelia Negative    52. Mixed Feathers Negative    53. Horse Epithelia Negative    54. Cockroach, [place] Negative  Intradermal - 09/05/23 1551     Time Antigen Placed 1551    Allergen Manufacturer Waynette Buttery    Location Arm    Number of Test 12    Control Negative    Bahia 2+    [place] Territ[place] 2+    Johnson 2+    7 Grass 2+    Ragweed Mix 2+    Mold 1 2+    Mold 2 2+    Mold 3 2+    Mold 4 Negative    Mite Mix Negative    Cockroach Negative             Allergy testing results were read and interpreted by provider, documented by clinical staff.   Assessment and plan: Allergic Rhinitis with Conjunctivitis Chronic symptoms of watery, itchy eyes, nasal congestion, and runny nose. Symptoms are year-round and have been present since last year. Over-the-counter antihistamines and nasal sprays have not been effective.  - Testing today showed: grasses, ragweed, weeds, trees, indoor molds, outdoor molds, and cat - Copy of test results provided.  - Avoidance measures provided.  - Continue with: -Xyzal (levocetirizine) daily.  If this is not effective then will try prescription Carbinoxamine.  -Ryaltris nasal spray for congestion and runniness.  Use 2 sprays  each nostril twice a day as needed.  This goes to a specialty pharmacy and they will contact you and mail it to you.  -Consider saline nasal rinses to improve effectiveness of medicated nasal spray.   Use saline rinse kit prior to nasal spray use.  Use distilled water or boil water and bring to room temperature (do not use tap water).  Keep mouth open during the rinse.  -Use Pataday 1 drop each eye daily as needed for itchy watery eyes - You can use an extra dose of the antihistamine, if needed, for breakthrough symptoms.   - Consider allergy shots as a means of long-term control. - Allergy shots "re-train" and "reset" the immune system to ignore environmental allergens and decrease the resulting immune response to those allergens (sneezing, itchy watery eyes, runny nose, nasal congestion, etc).    - Allergy shots improve symptoms in 75-85% of patients.  - We can discuss more at a future appointment if the medications are not working for you.  Unexplained Bruising New onset of unexplained, large random bruises since October. -CBC and clotting studies are normal -Recommend discussing with PCP for next steps   Follow-up 4-6 months or sooner if needed  I appreciate the opportunity to take part in Cambre's care. Please do not hesitate to contact me with questions.  Sincerely,   Margo Aye, MD Allergy/Immunology [facility]

## 2023-09-12 ENCOUNTER — Ambulatory Visit: Payer: 59 | Admitting: Family

## 2023-09-12 NOTE — Progress Notes (Deleted)
   Patient ID: Gina Burke, female    DOB: Jul 10, 1996, 28 y.o.   MRN: [id number]  No chief complaint on file.           Assessment & Plan:   Subjective:    Outpatient Medications Prior to Visit  Medication Sig Dispense Refill   levocetirizine (XYZAL) 5 MG tablet Take 1 tablet (5 mg total) by mouth every evening. 30 tablet 5   Olopatadine HCl 0.2 % SOLN Apply 1 drop to eye daily as needed (Itchy, watery eyes). 2.5 mL 5   RYALTRIS 665-25 MCG/ACT SUSP Place 2 sprays into the nose 2 (two) times daily as needed. 29 g 5   No facility-administered medications prior to visit.   Past Medical History:  Diagnosis Date   Abnormal uterine bleeding (AUB) 10/16/2020   Anemia    Fibroid    Past Surgical History:  Procedure Laterality Date   HYSTEROSCOPY N/A 12/24/2021   Procedure: Hysteroscopic Myomectomy with Myosure/ Dilation and Curettage;  Surgeon: Patton Salles, MD;  Location: [facility];  Service: Gynecology;  Laterality: N/A;   HYSTEROSCOPY WITH D & C N/A 12/20/2020   Procedure: DILATATION AND CURETTAGE /HYSTEROSCOPY with polyp removal;  Surgeon: Hermina Staggers, MD;  Location: [facility];  Service: Gynecology;  Laterality: N/A;   NO PAST SURGERIES     No Known Allergies    Objective:    Physical Exam Vitals and nursing note reviewed.  Constitutional:      Appearance: Normal appearance.  Cardiovascular:     Rate and Rhythm: Normal rate and regular rhythm.  Pulmonary:     Effort: Pulmonary effort is normal.     Breath sounds: Normal breath sounds.  Musculoskeletal:        General: Normal range of motion.  Skin:    General: Skin is warm and dry.  Neurological:     Mental Status: She is alert.  Psychiatric:        Mood and Affect: Mood normal.        Behavior: Behavior normal.    There were no vitals taken for this visit. Wt Readings from Last 3 Encounters:  08/28/23 141 lb 1.6 oz (64 kg)  02/18/23 138 lb (62.6 kg)  01/28/23  134 lb 9.6 oz (61.1 kg)       Dulce Sellar, NP

## 2023-11-07 ENCOUNTER — Ambulatory Visit (INDEPENDENT_AMBULATORY_CARE_PROVIDER_SITE_OTHER): Admitting: Family

## 2023-11-07 ENCOUNTER — Other Ambulatory Visit (HOSPITAL_COMMUNITY)
Admission: RE | Admit: 2023-11-07 | Discharge: 2023-11-07 | Disposition: A | Discharge status: Home / Self Care | Source: Ambulatory Visit | Attending: Family | Admitting: Family

## 2023-11-07 VITALS — BP 124/77 | HR 79 | Temp 97.7°F | Ht 64.5 in | Wt 137.8 lb

## 2023-11-07 DIAGNOSIS — Z113 Encounter for screening for infections with a predominantly sexual mode of transmission: Secondary | ICD-10-CM | POA: Diagnosis not present

## 2023-11-07 DIAGNOSIS — T07XXXA Unspecified multiple injuries, initial encounter: Secondary | ICD-10-CM

## 2023-11-07 NOTE — Progress Notes (Signed)
 Patient ID: Gina Burke, female    DOB: Jan 22, 1996, 28 y.o.   MRN: [id number]  Chief Complaint  Patient presents with   Bleeding/Bruising    Pt c/o random bruising on bilateral legs and arms. Pt denies any injury.    Discussed the use of AI scribe software for clinical note transcription with the patient, who gave verbal consent to proceed.  History of Present Illness The patient presents d/t a history of unexplained bruising on her legs and arms. The bruising began as small, inconspicuous marks that she attributed to minor bumps or collisions. However, she recently noticed a large, red and black bruise on her thigh that was sore to touch back in November and she is able to show a picture of this. She does not recall any injury or trauma that could have caused this bruise. She has also noticed smaller bruises popping up more often in different areas of her body since the large bruise appeared. She denies any hematomas. The patient denies any clumsiness or frequent collisions with objects that could explain the bruising. She likes to run for exercise but has not tripped or fallen while running. She also denies any regular use of medications like ibuprofen, Advil, fish oil, or aspirin that could thin the blood and contribute to bruising. She only takes these medications as needed, such as during her menstrual period. The patient's brother also bruises easily, but it is unclear if this is due to a known medical condition.  Assessment & Plan Easy Bruising - Normal blood work in Jan. Low likelihood of clotting disorders based on sx. Pt denies any family hx of clotting/bleeding disorders. Based on phone pic of bruise in November it appears flat, dark & light purple in color, approx 10cm in length & 4cm in width on anterior thigh, Referral to hematology recommended for patient piece of mind - Refer to hematology for further evaluation.  Sexually Transmitted Infection Screening - Agreed to STD testing  for chlamydia, Trichomonas and gonorrhea. - Order urine test for testing. -Results will be reviewed on MyChart in a few days.   Subjective:    Outpatient Medications Prior to Visit  Medication Sig Dispense Refill   levocetirizine (XYZAL) 5 MG tablet Take 1 tablet (5 mg total) by mouth every evening. (Patient not taking: Reported on 11/07/2023) 30 tablet 5   Olopatadine HCl 0.2 % SOLN Apply 1 drop to eye daily as needed (Itchy, watery eyes). (Patient not taking: Reported on 11/07/2023) 2.5 mL 5   RYALTRIS 665-25 MCG/ACT SUSP Place 2 sprays into the nose 2 (two) times daily as needed. (Patient not taking: Reported on 11/07/2023) 29 g 5   No facility-administered medications prior to visit.   Past Medical History:  Diagnosis Date   Abnormal uterine bleeding (AUB) 10/16/2020   Anemia    Fibroid    Past Surgical History:  Procedure Laterality Date   HYSTEROSCOPY N/A 12/24/2021   Procedure: Hysteroscopic Myomectomy with Myosure/ Dilation and Curettage;  Surgeon: Patton Salles, MD;  Location: [facility];  Service: Gynecology;  Laterality: N/A;   HYSTEROSCOPY WITH D & C N/A 12/20/2020   Procedure: DILATATION AND CURETTAGE /HYSTEROSCOPY with polyp removal;  Surgeon: Hermina Staggers, MD;  Location: [facility];  Service: Gynecology;  Laterality: N/A;   NO PAST SURGERIES     No Known Allergies    Objective:    Physical Exam Vitals and nursing note reviewed.  Constitutional:  Appearance: Normal appearance.  Cardiovascular:     Rate and Rhythm: Normal rate and regular rhythm.  Pulmonary:     Effort: Pulmonary effort is normal.     Breath sounds: Normal breath sounds.  Musculoskeletal:        General: Normal range of motion.  Skin:    General: Skin is warm and dry.  Neurological:     Mental Status: She is alert.  Psychiatric:        Mood and Affect: Mood normal.        Behavior: Behavior normal.    BP 124/77 (BP Location: Left Arm,  Patient Position: Sitting, Cuff Size: Normal)   Pulse 79   Temp 97.7 F (36.5 C) (Temporal)   Ht 5' 4.5" (1.638 m)   Wt 137 lb 12.8 oz (62.5 kg)   LMP 10/27/2023 (Exact Date)   SpO2 96%   BMI 23.29 kg/m  Wt Readings from Last 3 Encounters:  11/07/23 137 lb 12.8 oz (62.5 kg)  08/28/23 141 lb 1.6 oz (64 kg)  02/18/23 138 lb (62.6 kg)       Dulce Sellar, NP

## 2023-11-10 ENCOUNTER — Encounter: Payer: Self-pay | Admitting: Family

## 2023-11-10 LAB — URINE CYTOLOGY ANCILLARY ONLY
Chlamydia: NEGATIVE
Comment: NEGATIVE
Comment: NEGATIVE
Comment: NORMAL
Neisseria Gonorrhea: NEGATIVE
Trichomonas: NEGATIVE

## 2023-12-06 ENCOUNTER — Encounter: Payer: Self-pay | Admitting: Obstetrics and Gynecology

## 2023-12-09 NOTE — Telephone Encounter (Signed)
 Routing to [facility] to assist with scheduling.

## 2023-12-18 ENCOUNTER — Other Ambulatory Visit: Payer: Self-pay

## 2023-12-18 ENCOUNTER — Encounter: Payer: Self-pay | Admitting: Allergy

## 2023-12-18 ENCOUNTER — Ambulatory Visit (INDEPENDENT_AMBULATORY_CARE_PROVIDER_SITE_OTHER): Admitting: Allergy

## 2023-12-18 VITALS — BP 112/74 | HR 66 | Temp 98.6°F | Wt 143.5 lb

## 2023-12-18 DIAGNOSIS — H1013 Acute atopic conjunctivitis, bilateral: Secondary | ICD-10-CM | POA: Diagnosis not present

## 2023-12-18 DIAGNOSIS — J302 Other seasonal allergic rhinitis: Secondary | ICD-10-CM | POA: Diagnosis not present

## 2023-12-18 DIAGNOSIS — J3089 Other allergic rhinitis: Secondary | ICD-10-CM

## 2023-12-18 DIAGNOSIS — R233 Spontaneous ecchymoses: Secondary | ICD-10-CM

## 2023-12-18 NOTE — Patient Instructions (Addendum)
 Rhinoconjunctivitis, allergic - Continue avoidance measures for grasses, ragweed, weeds, trees, indoor molds, outdoor molds, and cat - Continue with: -Xyzal  (levocetirizine) daily.  If this becomes ineffective then changed to prescription Carbinoxamine.  -Ryaltris  nasal spray for congestion and runniness.  Use 2 sprays each nostril twice a day as needed.  This goes to a specialty pharmacy and they will contact you and mail it to you.  -Consider saline nasal rinses to improve effectiveness of medicated nasal spray.   Use saline rinse kit prior to nasal spray use.  Use distilled water or boil water and bring to room temperature (do not use tap water).  Keep mouth open during the rinse.  -Use Pataday  1 drop each eye daily as needed for itchy watery eyes - You can use an extra dose of the antihistamine, if needed, for breakthrough symptoms.   - Consider allergy  shots as a means of long-term control. - Allergy  shots "re-train" and "reset" the immune system to ignore environmental allergens and decrease the resulting immune response to those allergens (sneezing, itchy watery eyes, runny nose, nasal congestion, etc).    - Allergy  shots improve symptoms in 75-85% of patients.  - We can discuss more at a future appointment if the medications are not working for you.  Unexplained Bruising New onset of unexplained, large random bruises since October. -CBC and clotting studies are normal - She has been referred by her PCP to hematology oncology and is waiting to schedule an appointment   Follow-up 6 months or sooner if needed

## 2023-12-18 NOTE — Progress Notes (Signed)
 Follow-up Note  RE: Gina Burke MRN: [id number] DOB: [birth year] Date of Office Visit: 12/18/2023   History of present illness: Gina Burke is a 28 y.o. female presenting today for follow-up of allergic rhinitis with conjunctivitis.  She has also had unexplained bruising.  She was last seen in the office on 09/05/2023 by myself. Discussed the use of AI scribe software for clinical note transcription with the patient, who gave verbal consent to proceed.  She experiences random bruising without any apparent cause.  She has been referred by her primary care she is seeing hematology oncology regarding this.  She states she has not been able to make an appointment yet however.  She reports significant allergy  symptoms, primarily affecting her eyes, with episodes of extreme redness lasting about an hour before partially resolving. She suspects this may be due to rubbing her eyes. No sneezing is noted, and symptoms are mostly ocular. She uses over-the-counter Pataday  eye drops, which are helpful when available.  She is currently using Xyzal  (levocetirizine), which is effective but causes drowsiness when taken in the morning when she takes it in the evening. She also has ryaltris  nasal spray, which she finds effective when needed to use. Her nose is often stuffy, particularly when she does not get enough sleep.She has not experienced any sinus infections or required antibiotics since her last visit in January. No other significant symptoms or changes in her health are reported.     Review of systems: 10pt ROS negative unless noted above in HPI  Past medical/social/surgical/family history have been reviewed and are unchanged unless specifically indicated below.  No changes  Medication List: No current outpatient medications on file.   No current facility-administered medications for this visit.     Known medication allergies: No Known Allergies   Physical examination: Blood pressure  112/74, pulse 66, temperature 98.6 F (37 C), temperature source Temporal, weight 143 lb 8 oz (65.1 kg), SpO2 99%.  General: Alert, interactive, in no acute distress. HEENT: PERRLA, TMs pearly gray, turbinates moderately edematous without discharge, post-pharynx non erythematous. Neck: Supple without lymphadenopathy. Lungs: Clear to auscultation without wheezing, rhonchi or rales. {no increased work of breathing. CV: Normal S1, S2 without murmurs. Abdomen: Nondistended, nontender. Skin: Warm and dry, without lesions or rashes. Extremities:  No clubbing, cyanosis or edema. Neuro:   Grossly intact.  Diagnositics/Labs: Labs:  Component     Latest Ref Rng 08/28/2023  WBC     3.4 - 10.8 x10E3/uL 3.8   RBC     3.77 - 5.28 x10E6/uL 4.64   Hemoglobin     11.1 - 15.9 g/dL 62.1   HCT     30.8 - 65.7 % 43.7   MCV     79 - 97 fL 94   MCH     26.6 - 33.0 pg 31.5   MCHC     31.5 - 35.7 g/dL 84.6   RDW     96.2 - 95.2 % 12.1   Platelets     150 - 450 x10E3/uL 210   Neutrophils     Not Estab. % 38   Lymphs     Not Estab. % 48   Monocytes     Not Estab. % 10   Eos     Not Estab. % 4   Basos     Not Estab. % 0   NEUT#     1.4 - 7.0 x10E3/uL 1.4   Lymphs Abs     0.7 - 3.1  x10E3/uL 1.8   Monocytes Absolute     0.1 - 0.9 x10E3/uL 0.4   EOS (ABSOLUTE)     0.0 - 0.4 x10E3/uL 0.2   Basophils Absolute     0.0 - 0.2 x10E3/uL 0.0   Immature Granulocytes     Not Estab. % 0   Immature Grans (Abs)     0.0 - 0.1 x10E3/uL 0.0   INR     0.9 - 1.2  1.0   Prothrombin Time     9.1 - 12.0 sec 11.3   APTT     24 - 33 sec 30       Assessment and plan: Rhinoconjunctivitis, allergic - Continue avoidance measures for grasses, ragweed, weeds, trees, indoor molds, outdoor molds, and cat - Continue with: -Xyzal  (levocetirizine) daily.  If this becomes ineffective then changed to prescription Carbinoxamine.  -Ryaltris  nasal spray for congestion and runniness.  Use 2 sprays each nostril  twice a day as needed.  This goes to a specialty pharmacy and they will contact you and mail it to you.  -Consider saline nasal rinses to improve effectiveness of medicated nasal spray.   Use saline rinse kit prior to nasal spray use.  Use distilled water or boil water and bring to room temperature (do not use tap water).  Keep mouth open during the rinse.  -Use Pataday  1 drop each eye daily as needed for itchy watery eyes - You can use an extra dose of the antihistamine, if needed, for breakthrough symptoms.   - Consider allergy  shots as a means of long-term control. - Allergy  shots "re-train" and "reset" the immune system to ignore environmental allergens and decrease the resulting immune response to those allergens (sneezing, itchy watery eyes, runny nose, nasal congestion, etc).    - Allergy  shots improve symptoms in 75-85% of patients.  - We can discuss more at a future appointment if the medications are not working for you.  Unexplained Bruising New onset of unexplained, large random bruises since October. -CBC and clotting studies are normal - She has been referred by her PCP to hematology oncology and is waiting to schedule an appointment   Follow-up 6 months or sooner if needed  I appreciate the opportunity to take part in [name]'s care. Please do not hesitate to contact me with questions.  Sincerely,   Catha Clink, MD Allergy /Immunology [facility]

## 2023-12-19 MED ORDER — LEVOCETIRIZINE DIHYDROCHLORIDE 5 MG PO TABS: 5.0000 mg | ORAL_TABLET | Freq: Every evening | ORAL | 5 refills | Status: AC

## 2023-12-19 MED ORDER — RYALTRIS 665-25 MCG/ACT NA SUSP: 2.0000 | Freq: Two times a day (BID) | NASAL | 5 refills | Status: AC | PRN

## 2024-01-09 ENCOUNTER — Ambulatory Visit: Payer: 59 | Admitting: Allergy

## 2024-02-27 ENCOUNTER — Encounter: Payer: Self-pay | Admitting: Family

## 2024-02-27 ENCOUNTER — Encounter: Admitting: Family

## 2024-02-27 NOTE — Patient Instructions (Incomplete)
It was very nice to see you today!     I will review your lab results via MyChart in a few days.  Have a great rest of the summer!!      PLEASE NOTE:  If you had any lab tests please let us know if you have not heard back within a few days. You may see your results on MyChart before we have a chance to review them but we will give you a call once they are reviewed by [place]. If we ordered any referrals today, please let us know if you have not heard from their office within the next week.

## 2024-02-27 NOTE — Progress Notes (Deleted)
 Phone [phone]  Subjective:   Patient is a 28 y.o. female presenting for annual physical.    No chief complaint on file.   See problem oriented charting- ROS- full  review of systems was completed and negative.  The following were reviewed and entered/updated in epic: Past Medical History:  Diagnosis Date   Abnormal uterine bleeding (AUB) 10/16/2020   Anemia    Fibroid    Pediatric body mass index (BMI) of 5th percentile to less than 85th percentile for age 23/24/2014   Patient Active Problem List   Diagnosis Date Noted   Dysuria 01/28/2023   Cystitis 01/28/2023   Thickened endometrium    Refugee health examination 02/09/2013   Dysmenorrhea 02/09/2013   Past Surgical History:  Procedure Laterality Date   HYSTEROSCOPY N/A 12/24/2021   Procedure: Hysteroscopic Myomectomy with Myosure/ Dilation and Curettage;  Surgeon: Cathlyn JAYSON Nikki Bobie FORBES, MD;  Location: [facility];  Service: Gynecology;  Laterality: N/A;   HYSTEROSCOPY WITH D & C N/A 12/20/2020   Procedure: DILATATION AND CURETTAGE /HYSTEROSCOPY with polyp removal;  Surgeon: Lorence Ozell CROME, MD;  Location: [facility];  Service: Gynecology;  Laterality: N/A;   NO PAST SURGERIES      Family History  Problem Relation Age of Onset   Heart disease Brother     Medications- reviewed and updated Current Outpatient Medications  Medication Sig Dispense Refill   levocetirizine (XYZAL ) 5 MG tablet Take 1 tablet (5 mg total) by mouth every evening. 30 tablet 5   Olopatadine -Mometasone (RYALTRIS ) 665-25 MCG/ACT SUSP Place 2 sprays into the nose 2 (two) times daily as needed. 29 g 5   No current facility-administered medications for this visit.   Allergies-reviewed and updated No Known Allergies  Social History   Social History Narrative   Not on file    Objective:  There were no vitals taken for this visit. Physical Exam Vitals and nursing note reviewed.  Constitutional:       Appearance: Normal appearance. She is not ill-appearing.     Interventions: Face mask in place.  HENT:     Head: Normocephalic.     Right Ear: Tympanic membrane and ear canal normal.     Left Ear: Tympanic membrane and ear canal normal.     Nose:     Right Sinus: No frontal sinus tenderness.     Left Sinus: No frontal sinus tenderness.     Mouth/Throat:     Mouth: Mucous membranes are moist.     Pharynx: No pharyngeal swelling, oropharyngeal exudate, posterior oropharyngeal erythema or uvula swelling.     Tonsils: No tonsillar exudate or tonsillar abscesses.  Eyes:     Pupils: Pupils are equal, round, and reactive to light.  Cardiovascular:     Rate and Rhythm: Normal rate and regular rhythm.  Pulmonary:     Effort: Pulmonary effort is normal.     Breath sounds: Normal breath sounds.  Musculoskeletal:        General: Normal range of motion.     Cervical back: Normal range of motion.  Lymphadenopathy:     Head:     Right side of head: No preauricular or posterior auricular adenopathy.     Left side of head: No preauricular or posterior auricular adenopathy.     Cervical: No cervical adenopathy.  Skin:    General: Skin is warm and dry.  Neurological:     Mental Status: She is alert.  Psychiatric:  Mood and Affect: Mood normal.        Behavior: Behavior normal.      Assessment and Plan   Health Maintenance counseling: 1. Anticipatory guidance: Patient counseled regarding regular dental exams q6 months, eye exams,  avoiding smoking and second hand smoke, limiting alcohol to 1 beverage per day, no illicit drugs.   2. Risk factor reduction:  Advised patient of need for regular exercise and diet rich with fruits and vegetables to reduce risk of heart attack and stroke. Wt Readings from Last 3 Encounters:  12/18/23 143 lb 8 oz (65.1 kg)  11/07/23 137 lb 12.8 oz (62.5 kg)  08/28/23 141 lb 1.6 oz (64 kg)   3. Immunizations/screenings/ancillary studies Immunization History   Administered Date(s) Administered   Hepatitis B 02/09/2013   IPV 02/09/2013   Influenza Split 05/16/2015   MMR 02/09/2013   PPD Test 02/14/2022   Varicella 02/09/2013   Health Maintenance Due  Topic Date Due   Hepatitis B Vaccines (2 of 3 - 3-dose series) 03/09/2013   DTaP/Tdap/Td (1 - Tdap) Never done   HPV VACCINES (1 - 3-dose SCDM series) Never done   Cervical Cancer Screening (Pap smear)  11/02/2023    4. Cervical cancer screening: due 2025 5. Skin cancer screening- advised regular sunscreen use. Denies worrisome, changing, or new skin lesions.  6. Birth control/STD check: none, condoms, checking STDs today 7. Smoking associated screening: non- smoker 8. Alcohol screening: rare  There are no diagnoses linked to this encounter.  Recommended follow up:  No follow-ups on file. Future Appointments  Date Time Provider Department Center  02/27/2024  3:00 PM Lucius Krabbe, NP [facility]  03/24/2024  3:30 PM Cathlyn JAYSON Nikki Bobie FORBES, MD [facility] None  07/01/2024  5:40 PM Jeneal, Danita Macintosh, MD [facility] None    Lab/Order associations: fasting   Lucius Krabbe, NP

## 2024-03-24 ENCOUNTER — Ambulatory Visit: Admitting: Obstetrics and Gynecology

## 2024-04-14 ENCOUNTER — Ambulatory Visit (INDEPENDENT_AMBULATORY_CARE_PROVIDER_SITE_OTHER): Admitting: Family

## 2024-04-14 ENCOUNTER — Other Ambulatory Visit (HOSPITAL_COMMUNITY)
Admission: RE | Admit: 2024-04-14 | Discharge: 2024-04-14 | Disposition: A | Discharge status: Home / Self Care | Source: Ambulatory Visit | Attending: Family | Admitting: Family

## 2024-04-14 ENCOUNTER — Encounter: Payer: Self-pay | Admitting: Family

## 2024-04-14 VITALS — BP 106/72 | HR 89 | Temp 97.7°F | Ht 64.5 in | Wt 139.2 lb

## 2024-04-14 DIAGNOSIS — Z0000 Encounter for general adult medical examination without abnormal findings: Secondary | ICD-10-CM

## 2024-04-14 DIAGNOSIS — N898 Other specified noninflammatory disorders of vagina: Secondary | ICD-10-CM | POA: Diagnosis not present

## 2024-04-14 NOTE — Patient Instructions (Addendum)
 It was very nice to see you today!   I will review your lab results via MyChart in a few days.  If you have the thicker white discharge again, try over the counter vaginal creams for yeast. If not helping you can send me a MyChart message.  You look great! Stay well! Keep exercising!       PLEASE NOTE:  If you had any lab tests please let us  know if you have not heard back within a few days. You may see your results on MyChart before we have a chance to review them but we will give you a call once they are reviewed by us . If we ordered any referrals today, please let us  know if you have not heard from their office within the next week.

## 2024-04-14 NOTE — Progress Notes (Signed)
 Phone [phone]  Subjective:   Patient is a 28 y.o. female presenting for annual physical.    Chief Complaint  Patient presents with   Annual Exam    Fasting w/labs  Discussed the use of AI scribe software for clinical note transcription with the patient, who gave verbal consent to proceed.  History of Present Illness   Gina Burke is a 28 year old female who presents for an annual physical exam and Pap smear.  Preventive health maintenance - Presents for annual physical examination and Pap smear - Missed annual physical exam and Pap smear earlier in the year and requests Pap smear today  Fatigue and somnolence - Excessive fatigue and somnolence - Feels 'always tired' - Difficulty waking up once asleep  Menstrual history - Menstrual cycles are regular, occurring monthly - No excessive menstrual bleeding  Bowel habits - Bowel movements are regular and soft - Daily bowel movements without straining  Substance use - Consumes minimal alcohol - No tobacco, vaping, or other substance use  Fasting status - Fasting for laboratory tests - Consumed only water and coffee this morning     See problem oriented charting- ROS- full  review of systems was completed and negative.  The following were reviewed and entered/updated in epic: Past Medical History:  Diagnosis Date   Abnormal uterine bleeding (AUB) 10/16/2020   Anemia    Cystitis 01/28/2023   Dysmenorrhea 02/09/2013   Dysuria 01/28/2023   Fibroid    Pediatric body mass index (BMI) of 5th percentile to less than 85th percentile for age 26/24/2014   Patient Active Problem List   Diagnosis Date Noted   Thickened endometrium    Refugee health examination 02/09/2013   Past Surgical History:  Procedure Laterality Date   HYSTEROSCOPY N/A 12/24/2021   Procedure: Hysteroscopic Myomectomy with Myosure/ Dilation and Curettage;  Surgeon: Cathlyn JAYSON Nikki Bobie FORBES, MD;  Location: [facility];  Service:  Gynecology;  Laterality: N/A;   HYSTEROSCOPY WITH D & C N/A 12/20/2020   Procedure: DILATATION AND CURETTAGE /HYSTEROSCOPY with polyp removal;  Surgeon: Lorence Ozell CROME, MD;  Location: [facility];  Service: Gynecology;  Laterality: N/A;   NO PAST SURGERIES      Family History  Problem Relation Age of Onset   Heart disease Brother     Medications- reviewed and updated Current Outpatient Medications  Medication Sig Dispense Refill   levocetirizine (XYZAL ) 5 MG tablet Take 1 tablet (5 mg total) by mouth every evening. 30 tablet 5   Olopatadine -Mometasone (RYALTRIS ) 665-25 MCG/ACT SUSP Place 2 sprays into the nose 2 (two) times daily as needed. 29 g 5   No current facility-administered medications for this visit.   Allergies-reviewed and updated No Known Allergies  Social History   Social History Narrative   Not on file    Objective:  BP 106/72 (BP Location: Left Arm, Patient Position: Sitting, Cuff Size: Large)   Pulse 89   Temp 97.7 F (36.5 C) (Temporal)   Ht 5' 4.5 (1.638 m)   Wt 139 lb 3.2 oz (63.1 kg)   LMP 03/31/2024 (Exact Date)   SpO2 95%   BMI 23.52 kg/m  Physical Exam Vitals and nursing note reviewed. Exam conducted with a chaperone present.  Constitutional:      Appearance: Normal appearance. She is not ill-appearing.     Interventions: Face mask in place.  HENT:     Head: Normocephalic.     Right Ear: Tympanic membrane and ear canal  normal.     Left Ear: Tympanic membrane and ear canal normal.     Nose:     Right Sinus: No frontal sinus tenderness.     Left Sinus: No frontal sinus tenderness.     Mouth/Throat:     Mouth: Mucous membranes are moist.     Pharynx: No pharyngeal swelling, oropharyngeal exudate, posterior oropharyngeal erythema or uvula swelling.     Tonsils: No tonsillar exudate or tonsillar abscesses.  Eyes:     Pupils: Pupils are equal, round, and reactive to light.  Cardiovascular:     Rate and Rhythm: Normal rate and  regular rhythm.  Pulmonary:     Effort: Pulmonary effort is normal.     Breath sounds: Normal breath sounds.  Genitourinary:    Exam position: Lithotomy position.     Pubic Area: No rash or pubic lice.      Labia:        Right: No rash.        Left: No rash.      Vagina: Normal.     Cervix: Normal.     Comments: Specimen obtained for PAP smear. Musculoskeletal:        General: Normal range of motion.     Cervical back: Normal range of motion.  Lymphadenopathy:     Head:     Right side of head: No preauricular or posterior auricular adenopathy.     Left side of head: No preauricular or posterior auricular adenopathy.     Cervical: No cervical adenopathy.  Skin:    General: Skin is warm and dry.  Neurological:     Mental Status: She is alert.  Psychiatric:        Mood and Affect: Mood normal.        Behavior: Behavior normal.     Assessment and Plan   Health Maintenance counseling: 1. Anticipatory guidance: Patient counseled regarding regular dental exams q6 months, eye exams,  avoiding smoking and second hand smoke, limiting alcohol to 1 beverage per day, no illicit drugs.   2. Risk factor reduction:  Advised patient of need for regular exercise and diet rich with fruits and vegetables to reduce risk of heart attack and stroke. Wt Readings from Last 3 Encounters:  04/14/24 139 lb 3.2 oz (63.1 kg)  12/18/23 143 lb 8 oz (65.1 kg)  11/07/23 137 lb 12.8 oz (62.5 kg)   3. Immunizations/screenings/ancillary studies Immunization History  Administered Date(s) Administered   Hepatitis B 02/09/2013   IPV 02/09/2013   Influenza Split 05/16/2015   MMR 02/09/2013   PPD Test 02/14/2022   Varicella 02/09/2013   Health Maintenance Due  Topic Date Due   Cervical Cancer Screening (Pap smear)  11/02/2023   4. Cervical cancer screening: due 2025, doing today 5. Skin cancer screening- advised regular sunscreen use. Denies worrisome, changing, or new skin lesions.  6. Birth  control/STD check: none, condoms, checking STDs today 7. Smoking associated screening: non- smoker 8. Alcohol screening: rare    Adult Wellness Visit Routine wellness visit with optimal vitals and lifestyle habits. Excessive tiredness possibly due to altered sleep patterns. - Perform physical examination. - Order CBC, liver, and kidney function tests. - Encourage regular dental visits and oral hygiene. - Advise regular sleep schedule and exercise.  Screening for cervical cancer (Pap smear) Due for cervical cancer screening this year. - Perform Pap smear. - Offer STD testing with Pap smear.  Cerumen impaction, left ear (mild) Mild cerumen impaction in left ear,  likely from frequent earbud use. - Recommend hydrogen peroxide or mineral oil drops overnight for a few nights. - Advise warm water irrigation to remove cerumen.  Fatigue Excessive tiredness and difficulty waking up, likely due to altered sleep patterns. - Advise regular sleep schedule with normal bedtime and wake-up times.     Vaginal itching Has had in past with thick white discharge. - Advised to try OTC vaginal yeast creams - Keep area as dry as possible - Call or message office if sx are not resolving  Recommended follow up:  Return for any future concerns, Complete physical w/fasting labs. Future Appointments  Date Time Provider Department Center  07/01/2024  5:40 PM Jeneal Danita Macintosh, MD [facility] None   Lab/Order associations: fasting   Lucius Krabbe, NP

## 2024-04-15 LAB — LIPID PANEL
Cholesterol: 153 mg/dL (ref 0–200)
HDL: 59.9 mg/dL (ref >39.00)
LDL Cholesterol: 79 mg/dL (ref 0–99)
NonHDL: 93.18
Total CHOL/HDL Ratio: 3
Triglycerides: 72 mg/dL (ref 0.0–149.0)
VLDL: 14.4 mg/dL (ref 0.0–40.0)

## 2024-04-15 LAB — CBC WITH DIFFERENTIAL/PLATELET
Basophils Absolute: 0 K/uL (ref 0.0–0.1)
Basophils Relative: 0.2 % (ref 0.0–3.0)
Eosinophils Absolute: 0.1 K/uL (ref 0.0–0.7)
Eosinophils Relative: 0.9 % (ref 0.0–5.0)
HCT: 42.6 % (ref 36.0–46.0)
Hemoglobin: 14.3 g/dL (ref 12.0–15.0)
Lymphocytes Relative: 36.8 % (ref 12.0–46.0)
Lymphs Abs: 2.2 K/uL (ref 0.7–4.0)
MCHC: 33.5 g/dL (ref 30.0–36.0)
MCV: 92.2 fl (ref 78.0–100.0)
Monocytes Absolute: 0.6 K/uL (ref 0.1–1.0)
Monocytes Relative: 9.9 % (ref 3.0–12.0)
Neutro Abs: 3.1 K/uL (ref 1.4–7.7)
Neutrophils Relative %: 52.2 % (ref 43.0–77.0)
Platelets: 224 K/uL (ref 150.0–400.0)
RBC: 4.62 Mil/uL (ref 3.87–5.11)
RDW: 13 % (ref 11.5–15.5)
WBC: 5.9 K/uL (ref 4.0–10.5)

## 2024-04-15 LAB — COMPREHENSIVE METABOLIC PANEL WITH GFR
ALT: 14 U/L (ref 0–35)
AST: 26 U/L (ref 0–37)
Albumin: 4.5 g/dL (ref 3.5–5.2)
Alkaline Phosphatase: 51 U/L (ref 39–117)
BUN: 6 mg/dL (ref 6–23)
CO2: 25 meq/L (ref 19–32)
Calcium: 9.5 mg/dL (ref 8.4–10.5)
Chloride: 100 meq/L (ref 96–112)
Creatinine, Ser: 0.57 mg/dL (ref 0.40–1.20)
GFR: 124.22 mL/min (ref >60.00)
Glucose, Bld: 72 mg/dL (ref 70–99)
Potassium: 3.9 meq/L (ref 3.5–5.1)
Sodium: 138 meq/L (ref 135–145)
Total Bilirubin: 0.5 mg/dL (ref 0.2–1.2)
Total Protein: 7.9 g/dL (ref 6.0–8.3)

## 2024-04-16 ENCOUNTER — Ambulatory Visit: Payer: Self-pay | Admitting: Family

## 2024-04-16 LAB — CYTOLOGY - PAP
Chlamydia: NEGATIVE
Comment: NEGATIVE
Comment: NORMAL
Diagnosis: NEGATIVE
Neisseria Gonorrhea: NEGATIVE

## 2024-07-01 ENCOUNTER — Ambulatory Visit: Admitting: Allergy

## 2025-04-18 ENCOUNTER — Encounter: Admitting: Family
